# Patient Record
Sex: Male | Born: 1960
Health system: Southern US, Community
[De-identification: ages and names within clinical notes are randomized; demographics above are authoritative.]

## PROBLEM LIST (undated history)

## (undated) DIAGNOSIS — M1990 Unspecified osteoarthritis, unspecified site: Secondary | ICD-10-CM

## (undated) DIAGNOSIS — M069 Rheumatoid arthritis, unspecified: Secondary | ICD-10-CM

## (undated) DIAGNOSIS — E785 Hyperlipidemia, unspecified: Secondary | ICD-10-CM

## (undated) DIAGNOSIS — Z87442 Personal history of urinary calculi: Secondary | ICD-10-CM

## (undated) DIAGNOSIS — I10 Essential (primary) hypertension: Secondary | ICD-10-CM

## (undated) DIAGNOSIS — K635 Polyp of colon: Secondary | ICD-10-CM

## (undated) HISTORY — DX: Unspecified osteoarthritis, unspecified site: M19.90

## (undated) HISTORY — PX: OTHER SURGICAL HISTORY: SHX169

## (undated) HISTORY — DX: Essential (primary) hypertension: I10

## (undated) HISTORY — PX: MENISCUS REPAIR: SHX5179

## (undated) HISTORY — PX: COLONOSCOPY: SHX174

## (undated) HISTORY — PX: POLYPECTOMY: SHX149

## (undated) HISTORY — DX: Polyp of colon: K63.5

## (undated) HISTORY — DX: Hyperlipidemia, unspecified: E78.5

## (undated) HISTORY — PX: HERNIA REPAIR: SHX51

---

## 1999-10-08 ENCOUNTER — Emergency Department (HOSPITAL_COMMUNITY): Admission: EM | Admit: 1999-10-08 | Discharge: 1999-10-08 | Payer: Self-pay | Admitting: Emergency Medicine

## 1999-10-08 ENCOUNTER — Encounter: Payer: Self-pay | Admitting: Emergency Medicine

## 1999-10-09 ENCOUNTER — Emergency Department (HOSPITAL_COMMUNITY): Admission: EM | Admit: 1999-10-09 | Discharge: 1999-10-09 | Payer: Self-pay | Admitting: Emergency Medicine

## 2002-09-20 ENCOUNTER — Encounter: Admission: RE | Admit: 2002-09-20 | Discharge: 2002-09-20 | Payer: Self-pay | Admitting: Internal Medicine

## 2002-09-20 ENCOUNTER — Encounter (HOSPITAL_BASED_OUTPATIENT_CLINIC_OR_DEPARTMENT_OTHER): Payer: Self-pay | Admitting: Internal Medicine

## 2005-08-31 ENCOUNTER — Emergency Department (HOSPITAL_COMMUNITY): Admission: EM | Admit: 2005-08-31 | Discharge: 2005-08-31 | Payer: Self-pay | Admitting: Emergency Medicine

## 2008-08-15 ENCOUNTER — Ambulatory Visit: Payer: Self-pay | Admitting: Internal Medicine

## 2008-08-15 DIAGNOSIS — K625 Hemorrhage of anus and rectum: Secondary | ICD-10-CM | POA: Insufficient documentation

## 2008-09-04 ENCOUNTER — Ambulatory Visit: Payer: Self-pay | Admitting: Internal Medicine

## 2008-09-04 ENCOUNTER — Encounter: Payer: Self-pay | Admitting: Internal Medicine

## 2008-09-07 ENCOUNTER — Encounter: Payer: Self-pay | Admitting: Internal Medicine

## 2009-12-22 ENCOUNTER — Emergency Department (HOSPITAL_COMMUNITY): Admission: EM | Admit: 2009-12-22 | Discharge: 2009-12-22 | Payer: Self-pay | Admitting: Family Medicine

## 2010-01-11 ENCOUNTER — Emergency Department (HOSPITAL_COMMUNITY): Admission: EM | Admit: 2010-01-11 | Discharge: 2010-01-12 | Payer: Self-pay | Admitting: Emergency Medicine

## 2012-04-06 ENCOUNTER — Encounter (HOSPITAL_COMMUNITY): Payer: Self-pay | Admitting: Emergency Medicine

## 2012-04-06 ENCOUNTER — Emergency Department (HOSPITAL_COMMUNITY): Payer: BC Managed Care – PPO

## 2012-04-06 ENCOUNTER — Emergency Department (HOSPITAL_COMMUNITY)
Admission: EM | Admit: 2012-04-06 | Discharge: 2012-04-06 | Disposition: A | Discharge status: Home / Self Care | Payer: BC Managed Care – PPO | Attending: Emergency Medicine | Admitting: Emergency Medicine

## 2012-04-06 DIAGNOSIS — N200 Calculus of kidney: Secondary | ICD-10-CM | POA: Insufficient documentation

## 2012-04-06 DIAGNOSIS — F17200 Nicotine dependence, unspecified, uncomplicated: Secondary | ICD-10-CM | POA: Insufficient documentation

## 2012-04-06 LAB — CBC WITH DIFFERENTIAL/PLATELET
Basophils Relative: 0 % (ref 0–1)
Eosinophils Absolute: 0.2 10*3/uL (ref 0.0–0.7)
Eosinophils Relative: 1 % (ref 0–5)
Hemoglobin: 16.5 g/dL (ref 13.0–17.0)
MCH: 30 pg (ref 26.0–34.0)
MCHC: 34.4 g/dL (ref 30.0–36.0)
MCV: 87.1 fL (ref 78.0–100.0)
Monocytes Absolute: 1.2 10*3/uL — ABNORMAL HIGH (ref 0.1–1.0)
Monocytes Relative: 10 % (ref 3–12)
Neutrophils Relative %: 72 % (ref 43–77)
Platelets: 229 10*3/uL (ref 150–400)
RBC: 5.5 MIL/uL (ref 4.22–5.81)
WBC: 12.1 10*3/uL — ABNORMAL HIGH (ref 4.0–10.5)

## 2012-04-06 LAB — POCT I-STAT, CHEM 8
Calcium, Ion: 1.16 mmol/L (ref 1.12–1.23)
Chloride: 106 mEq/L (ref 96–112)
Creatinine, Ser: 1.1 mg/dL (ref 0.50–1.35)
Glucose, Bld: 137 mg/dL — ABNORMAL HIGH (ref 70–99)
HCT: 50 % (ref 39.0–52.0)
Hemoglobin: 17 g/dL (ref 13.0–17.0)
Potassium: 3.5 mEq/L (ref 3.5–5.1)
Sodium: 143 mEq/L (ref 135–145)
TCO2: 25 mmol/L (ref 0–100)

## 2012-04-06 MED ORDER — ONDANSETRON HCL 4 MG/2ML IJ SOLN
INTRAMUSCULAR | Status: AC
  Filled 2012-04-06: qty 2

## 2012-04-06 MED ORDER — SODIUM CHLORIDE 0.9 % IV BOLUS (SEPSIS)
1000.0000 mL | Freq: Once | INTRAVENOUS | Status: AC
  Administered 2012-04-06: 1000 mL via INTRAVENOUS

## 2012-04-06 MED ORDER — HYDROMORPHONE HCL PF 1 MG/ML IJ SOLN
1.0000 mg | Freq: Once | INTRAMUSCULAR | Status: AC
  Administered 2012-04-06: 1 mg via INTRAVENOUS
  Filled 2012-04-06: qty 1

## 2012-04-06 MED ORDER — ONDANSETRON HCL 4 MG/2ML IJ SOLN
4.0000 mg | Freq: Once | INTRAMUSCULAR | Status: AC
  Administered 2012-04-06: 4 mg via INTRAVENOUS

## 2012-04-06 MED ORDER — KETOROLAC TROMETHAMINE 30 MG/ML IJ SOLN
30.0000 mg | Freq: Once | INTRAMUSCULAR | Status: AC
  Administered 2012-04-06: 30 mg via INTRAVENOUS
  Filled 2012-04-06: qty 1

## 2012-04-06 MED ORDER — HYDROCODONE-ACETAMINOPHEN 5-500 MG PO TABS: 1.0000 | ORAL_TABLET | Freq: Four times a day (QID) | ORAL | Status: AC | PRN

## 2012-04-06 NOTE — ED Notes (Signed)
[  ptn reports abd pain onset x1.5 hrs ago  Pt reports emesis x3  Denies diarrhea

## 2012-04-06 NOTE — ED Notes (Signed)
Pt stated he had to go to the bathroom as soon as he got to room. Pt given urinal and asked for sample.

## 2012-04-06 NOTE — ED Provider Notes (Addendum)
History     CSN: [id number]  Arrival date & time 04/06/12  4098   First MD Initiated Contact with Patient 04/06/12 [phone]      Chief Complaint  Patient presents with  . Abdominal Pain    (Consider location/radiation/quality/duration/timing/severity/associated sxs/prior treatment) Patient is a 51 y.o. male presenting with abdominal pain. The history is provided by the patient.  Abdominal Pain The primary symptoms of the illness include abdominal pain, nausea and vomiting. The primary symptoms of the illness do not include fever, shortness of breath, diarrhea or dysuria. The current episode started 1 to 2 hours ago. The onset of the illness was sudden. The problem has not changed since onset. The abdominal pain is located in the LLQ. The abdominal pain does not radiate. The severity of the abdominal pain is 10/10. The abdominal pain is relieved by nothing. Exacerbated by: nothing.  The vomiting began today. Vomiting occurs 2 to 5 times per day. The emesis contains stomach contents.  The patient has not had a change in bowel habit. Symptoms associated with the illness do not include urgency, frequency or back pain. Significant associated medical issues do not include GERD, inflammatory bowel disease, diabetes, gallstones, diverticulitis or cardiac disease.    History reviewed. No pertinent past medical history.  Past Surgical History  Procedure Date  . Hernia repair     History reviewed. No pertinent family history.  History  Substance Use Topics  . Smoking status: Current Everyday Smoker  . Smokeless tobacco: Not on file  . Alcohol Use: Yes     occassioanl      Review of Systems  Constitutional: Negative for fever.  Respiratory: Negative for shortness of breath.   Gastrointestinal: Positive for nausea, vomiting and abdominal pain. Negative for diarrhea.  Genitourinary: Negative for dysuria, urgency and frequency.  Musculoskeletal: Negative for back pain.  All other systems  reviewed and are negative.    Allergies  Review of patient's allergies indicates no known allergies.  Home Medications  No current outpatient prescriptions on file.  BP 119/72  Pulse 115  Temp 97.7 F (36.5 C) (Oral)  Resp 22  SpO2 100%  Physical Exam  Nursing note and vitals reviewed. Constitutional: He is oriented to person, place, and time. He appears well-developed and well-nourished. He appears distressed.       Pacing in the room obviously in pain  HENT:  Head: Normocephalic and atraumatic.  Mouth/Throat: Oropharynx is clear and moist.  Eyes: Conjunctivae and EOM are normal. Pupils are equal, round, and reactive to light.  Neck: Normal range of motion. Neck supple.  Cardiovascular: Normal rate, regular rhythm and intact distal pulses.   No murmur heard. Pulmonary/Chest: Effort normal and breath sounds normal. No respiratory distress. He has no wheezes. He has no rales.  Abdominal: Soft. Normal appearance. He exhibits no distension. There is no tenderness. There is no rebound, no guarding and no CVA tenderness.    Musculoskeletal: Normal range of motion. He exhibits no edema and no tenderness.  Neurological: He is alert and oriented to person, place, and time.  Skin: Skin is warm and dry. No rash noted. No erythema.  Psychiatric: He has a normal mood and affect. His behavior is normal.    ED Course  Procedures (including critical care time)  Labs Reviewed  CBC WITH DIFFERENTIAL - Abnormal; Notable for the following:    WBC 12.1 (*)     Neutro Abs 8.7 (*)     Monocytes Absolute 1.2 (*)  All other components within normal limits  POCT I-STAT, CHEM 8 - Abnormal; Notable for the following:    Glucose, Bld 137 (*)     All other components within normal limits  URINALYSIS, ROUTINE W REFLEX MICROSCOPIC   Ct Abdomen Pelvis Wo Contrast  04/06/2012  *RADIOLOGY REPORT*  Clinical Data: Left groin pain with nausea and vomiting.  Question ureteral calculus.  CT ABDOMEN  AND PELVIS WITHOUT CONTRAST  Technique:  Multidetector CT imaging of the abdomen and pelvis was performed following the standard protocol without intravenous contrast.  Comparison: Abdominal pelvic CT 08/31/2005.  Findings: There is moderate left-sided hydronephrosis and perinephric soft tissue stranding.  No renal calculi are demonstrated.  However, there is a 1 mm calculus at the left ureteral vesicle junction on image 78.  This may have already passed into the bladder which is decompressed.  The right ureter is normal in caliber.  No focal renal abnormalities are seen.  The lung bases are clear.  There is no pleural effusion.  There is a 1.7 cm ill-defined low density lesion anteriorly in the liver on above the falciform ligament on image 15.  Although this measures higher than fat density and is larger than on the prior study, this could reflect focal fat based on location.  No other focal lesions are identified.  The spleen, gallbladder, pancreas and adrenal glands appear unremarkable as imaged in the noncontrast state.  The bowel gas pattern is normal.  The appendix appears normal.  No adenopathy or ascites is seen. There is new irregular spurring along the inferior aspect of the right pubic bone at the adductor muscle attachment.  This may reflect enthesophyte formation or a post-traumatic finding.  The osseous structures otherwise appear unremarkable.  IMPRESSION:  1.  Obstructing 1 mm calculus at the left ureteral vesicle junction with associated hydronephrosis and perinephric soft tissue stranding.  This may have already passed into the bladder. 2.  No other urinary tract calculi demonstrated. 3.  Nonspecific low density hepatic lesion has a location suggesting focal fat.  Original Report Authenticated By: Gerrianne Scale, M.D.     1. Kidney stone       MDM   Pt with symptoms consistent with kidney stone.  Denies infectious sx, or GI symptoms.  Low concern for diverticulitis and no risk  factors or history suggestive of AAA.  No hx suggestive of GU source (discharge) and otherwise pt is healthy.  Will hydrate, treat pain and ensure no infection with UA, CBC, i-stat and will get stone study to further eval.  9:23 AM On re-eval pt is much improved.  CT showed 1mm stone which may have passed.  Will d/c pt home to return for any problems.       Gwyneth Sprout, MD 04/06/12 1610  Gwyneth Sprout, MD 04/06/12 [phone]

## 2015-03-01 ENCOUNTER — Encounter: Payer: Self-pay | Admitting: Internal Medicine

## 2015-05-09 ENCOUNTER — Telehealth: Payer: Self-pay | Admitting: Internal Medicine

## 2015-05-09 NOTE — Telephone Encounter (Signed)
Pt was due in 2014

## 2015-07-12 ENCOUNTER — Encounter: Payer: Self-pay | Admitting: Internal Medicine

## 2016-02-11 DIAGNOSIS — H5202 Hypermetropia, left eye: Secondary | ICD-10-CM | POA: Diagnosis not present

## 2016-02-11 DIAGNOSIS — H524 Presbyopia: Secondary | ICD-10-CM | POA: Diagnosis not present

## 2016-02-11 DIAGNOSIS — H52223 Regular astigmatism, bilateral: Secondary | ICD-10-CM | POA: Diagnosis not present

## 2016-04-07 DIAGNOSIS — E784 Other hyperlipidemia: Secondary | ICD-10-CM | POA: Diagnosis not present

## 2016-04-07 DIAGNOSIS — H9193 Unspecified hearing loss, bilateral: Secondary | ICD-10-CM | POA: Diagnosis not present

## 2016-04-07 DIAGNOSIS — R030 Elevated blood-pressure reading, without diagnosis of hypertension: Secondary | ICD-10-CM | POA: Diagnosis not present

## 2016-04-07 DIAGNOSIS — R079 Chest pain, unspecified: Secondary | ICD-10-CM | POA: Diagnosis not present

## 2016-05-07 ENCOUNTER — Encounter: Payer: Self-pay | Admitting: Cardiology

## 2016-05-14 DIAGNOSIS — Z125 Encounter for screening for malignant neoplasm of prostate: Secondary | ICD-10-CM | POA: Diagnosis not present

## 2016-05-14 DIAGNOSIS — Z0000 Encounter for general adult medical examination without abnormal findings: Secondary | ICD-10-CM | POA: Diagnosis not present

## 2016-05-19 DIAGNOSIS — Z1389 Encounter for screening for other disorder: Secondary | ICD-10-CM | POA: Diagnosis not present

## 2016-05-19 DIAGNOSIS — Z0000 Encounter for general adult medical examination without abnormal findings: Secondary | ICD-10-CM | POA: Diagnosis not present

## 2016-05-19 DIAGNOSIS — N200 Calculus of kidney: Secondary | ICD-10-CM | POA: Diagnosis not present

## 2016-05-19 DIAGNOSIS — M25532 Pain in left wrist: Secondary | ICD-10-CM | POA: Diagnosis not present

## 2016-05-19 DIAGNOSIS — E784 Other hyperlipidemia: Secondary | ICD-10-CM | POA: Diagnosis not present

## 2016-05-19 DIAGNOSIS — M25531 Pain in right wrist: Secondary | ICD-10-CM | POA: Diagnosis not present

## 2016-06-13 ENCOUNTER — Telehealth: Payer: Self-pay | Admitting: Physician Assistant

## 2016-06-13 NOTE — Telephone Encounter (Signed)
Received records from [facility]. For appointment with Rosaria Ferries on 06/27/2016 2:30pm. Records given to [facility] (medical records) 06/13/2016 mwc

## 2016-06-27 ENCOUNTER — Encounter: Payer: Self-pay | Admitting: Physician Assistant

## 2016-06-27 ENCOUNTER — Ambulatory Visit (INDEPENDENT_AMBULATORY_CARE_PROVIDER_SITE_OTHER): Payer: BLUE CROSS/BLUE SHIELD | Admitting: Physician Assistant

## 2016-06-27 VITALS — BP 136/88 | HR 74 | Ht 71.5 in | Wt 191.0 lb

## 2016-06-27 DIAGNOSIS — R072 Precordial pain: Secondary | ICD-10-CM | POA: Diagnosis not present

## 2016-06-27 DIAGNOSIS — R079 Chest pain, unspecified: Secondary | ICD-10-CM

## 2016-06-27 NOTE — Patient Instructions (Signed)
Medication Instructions:  Your physician recommends that you continue on your current medications as directed. Please refer to the Current Medication list given to you today.   Labwork: NONE   Testing/Procedures:  Your physician has requested that you have an exercise tolerance test. For further information please visit [email or web address]. Please also follow instruction sheet, as given.   Follow-Up: Schedule appointment after test with Rosaria Ferries, PA-C pending results.   Any Other Special Instructions Are Listed Below:   Exercise Stress Electrocardiogram An exercise stress electrocardiogram is a test to check how blood flows to your heart. It is done to find areas of poor blood flow. You will need to walk on a treadmill for this test. The electrocardiogram will record your heartbeat when you are at rest and when you are exercising. BEFORE THE PROCEDURE  Do not have drinks with caffeine or foods with caffeine for 24 hours before the test, or as told by your doctor. This includes coffee, tea (even decaf tea), sodas, chocolate, and cocoa.  Follow your doctor's instructions about eating and drinking before the test.  Ask your doctor what medicines you should or should not take before the test. Take your medicines with water unless told by your doctor not to.  If you use an inhaler, bring it with you to the test.  Bring a snack to eat after the test.  Do not  smoke for 4 hours before the test.  Do not put lotions, powders, creams, or oils on your chest before the test.  Wear comfortable shoes and clothing. PROCEDURE  You will have patches put on your chest. Small areas of your chest may need to be shaved. Wires will be connected to the patches.  Your heart rate will be watched while you are resting and while you are exercising.  You will walk on the treadmill. The treadmill will slowly get faster to raise your heart rate.  The test will take about 1-2 hours. AFTER THE  PROCEDURE  Your heart rate and blood pressure will be watched after the test.  You may return to your normal diet, activities, and medicines or as told by your doctor.   This information is not intended to replace advice given to you by your health care provider. Make sure you discuss any questions you have with your health care provider.   Document Released: 02/11/2008 Document Revised: 09/15/2014 Document Reviewed: 05/02/2013 Elsevier Interactive Patient Education [employer].    If you need a refill on your cardiac medications before your next appointment, please call your pharmacy.

## 2016-06-27 NOTE — Progress Notes (Signed)
Cardiology Office Note   Date:  06/27/2016   ID:  Eric Murray, DOB October 09, 1960, MRN [id number]  PCP:  Donnajean Lopes, MD  Cardiologist:  New - Dr Martinique Jena Tegeler, PA-C   Chief Complaint  Patient presents with  . Chest Pain    History of Present Illness: Eric Murray is a 55 y.o. male with a history of recently diagnosed HTN, HLD, tob use  Eric Murray presents for evaluation of chest pain.   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 He was not getting any rest, was smoking too much and was eating poorly.   At his vacation in August, he admits to dietary indiscretions with bad food into much alcohol as well as excessive tobacco use. After he came back, he was doing some physical work which she had not done in a while. He developed a burning lower sternal/epigastric pain. He thinks it was a 7 or 8/10. It hurt to the point that he had to stop what he was doing. It hurt to take a deep breath. After he stopped what he was doing, and rested about 20 minutes, the pain went away. He had not taken any medications for it. He does not remember if there was any difference with position change. It may have been a little worse with deep inspiration, but he is not sure.   He had similar symptoms like this once before, he was on vacation and on a boat with some friends and relatives. It was not as severe and went away very quickly. This was 6 or 7 years ago.  He has no history of exertional chest pain.  After the episode in August, he realized he needed to make a change. 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 This decreased his stress and increased his activity level. He is now doing what he used to do and the job requires lifting equipment, climbing ladders and doing other things that are fairly strenuous at times. In doing all of these, he has not had any additional  episodes of chest pain. He is trying to decrease smoking and is down to just under a pack a day. He eats generally healthy, but admits that he drinks beer on the weekends and eats fast food at times. He is compliant with his medications and trying to live a healthier lifestyle.   Past Medical History:  Diagnosis Date  . Hyperlipidemia     Past Surgical History:  Procedure Laterality Date  . HERNIA REPAIR      Current Outpatient Prescriptions  Medication Sig Dispense Refill  . amLODipine (NORVASC) 5 MG tablet Take 1 tablet by mouth daily.  12  . aspirin EC 81 MG tablet Take 81 mg by mouth daily.    . pravastatin (PRAVACHOL) 40 MG tablet Take 1 tablet by mouth daily.  12   No current facility-administered medications for this visit.     Allergies:   Review of patient's allergies indicates no known allergies.    Social History:  The patient  reports that he has been smoking.  He does not have any smokeless tobacco history on file. He reports that he drinks alcohol. He reports that he does not use drugs.   Family History:  The patient's family history includes Breast cancer in his sister; Cushing syndrome in his brother; Pancreatic cancer in his father; Prostate cancer in his  father.    ROS:  Please see the history of present illness. All other systems are reviewed and negative.    PHYSICAL EXAM: VS:  BP 136/88   Pulse 74   Ht 5' 11.5" (1.816 m)   Wt 191 lb (86.6 kg)   BMI 26.27 kg/m  , BMI Body mass index is 26.27 kg/m. GEN: Well nourished, well developed, male in no acute distress  HEENT: normal for age  Neck: no JVD, no carotid bruit, no masses Cardiac: RRR; no murmur, no rubs, or gallops Respiratory:  clear to auscultation bilaterally, normal work of breathing GI: soft, nontender, nondistended, + BS MS: no deformity or atrophy; no edema; distal pulses are 2+ in all 4 extremities   Skin: warm and dry, no rash Neuro:  Strength and sensation are intact Psych: euthymic  mood, full affect   EKG:  EKG is ordered today. The ekg ordered today demonstrates Sinus rhythm, heart rate 74, no acute ischemic changes, intervals within normal limits   Recent Labs: No results found for requested labs within last 8760 hours.    Lipid Panel No results found for: CHOL, TRIG, HDL, CHOLHDL, VLDL, LDLCALC, LDLDIRECT   Wt Readings from Last 3 Encounters:  06/27/16 191 lb (86.6 kg)  08/15/08 190 lb (86.2 kg)     Other studies Reviewed: Additional studies/ records that were reviewed today include: Records from [place]: Dr. Martinique was in to see the patient  1.  Chest pain: His cardiac risk factors are hypertension, hyperlipidemia and tobacco use. However, he has not had consistent exertional symptoms and his episodes of chest pain have, in the setting of dietary indiscretions. Multiple cardiac risk factors, assessment is indicated.  We will schedule an exercise treadmill test. If his exercise tolerance is poor, he has symptoms during the test, or his ECG becomes abnormal, further evaluation is indicated.  If the exercise treadmill test is normal, follow-up with [facility] with cardiac risk factor reduction encouraged. As his father died of cancer, he is strongly urged to quit smoking.   Current medicines are reviewed at length with the patient today.  The patient does not have concerns regarding medicines.  The following changes have been made:  no change  Labs/ tests ordered today include:  No orders of the defined types were placed in this encounter.    Disposition:   FU with Dr. Martinique  Signed, Rosaria Ferries, PA-C  06/27/2016 2:38 PM    [place] Phone: [phone]; Fax: [phone]  This note was written with the assistance of speech recognition software. Please excuse any transcriptional errors.

## 2016-07-09 ENCOUNTER — Telehealth (HOSPITAL_COMMUNITY): Payer: Self-pay

## 2016-07-09 NOTE — Telephone Encounter (Signed)
Encounter complete. 

## 2016-07-11 ENCOUNTER — Ambulatory Visit (HOSPITAL_COMMUNITY)
Admission: RE | Admit: 2016-07-11 | Discharge: 2016-07-11 | Disposition: A | Discharge status: Home / Self Care | Payer: BLUE CROSS/BLUE SHIELD | Source: Ambulatory Visit | Attending: Cardiology | Admitting: Cardiology

## 2016-07-11 DIAGNOSIS — R072 Precordial pain: Secondary | ICD-10-CM | POA: Diagnosis not present

## 2016-07-11 LAB — EXERCISE TOLERANCE TEST
Estimated workload: 11.9 METS
Exercise duration (min): 10 min
Exercise duration (sec): 13 s
MPHR: 167 {beats}/min
Peak HR: 160 {beats}/min
Percent HR: 95 %
RPE: 17
Rest HR: 75 {beats}/min

## 2016-07-15 ENCOUNTER — Encounter: Payer: Self-pay | Admitting: Physician Assistant

## 2016-07-18 ENCOUNTER — Ambulatory Visit (INDEPENDENT_AMBULATORY_CARE_PROVIDER_SITE_OTHER): Payer: BLUE CROSS/BLUE SHIELD | Admitting: Physician Assistant

## 2016-07-18 ENCOUNTER — Encounter: Payer: Self-pay | Admitting: Physician Assistant

## 2016-07-18 VITALS — BP 132/91 | HR 78 | Ht 71.5 in | Wt 193.4 lb

## 2016-07-18 DIAGNOSIS — R079 Chest pain, unspecified: Secondary | ICD-10-CM | POA: Diagnosis not present

## 2016-07-18 DIAGNOSIS — Z720 Tobacco use: Secondary | ICD-10-CM

## 2016-07-18 DIAGNOSIS — I10 Essential (primary) hypertension: Secondary | ICD-10-CM | POA: Diagnosis not present

## 2016-07-18 MED ORDER — VARENICLINE TARTRATE 1 MG PO TABS: 1.0000 mg | ORAL_TABLET | Freq: Two times a day (BID) | ORAL | 1 refills | Status: DC

## 2016-07-18 MED ORDER — VARENICLINE TARTRATE 0.5 MG X 11 & 1 MG X 42 PO MISC
ORAL | 0 refills | Status: DC
Start: 2016-07-18 — End: 2016-07-18

## 2016-07-18 MED ORDER — VARENICLINE TARTRATE 0.5 MG X 11 & 1 MG X 42 PO MISC
ORAL | 0 refills | Status: DC
Start: 2016-07-18 — End: 2016-10-06

## 2016-07-18 NOTE — Progress Notes (Signed)
Cardiology Office Note   Date:  07/18/2016   ID:  Eric Murray, DOB 1961-05-22, MRN [id number]  PCP:  Donnajean Lopes, MD  Cardiologist:  Dr Martinique 06/27/2016  Rosaria Ferries, PA-C 06/27/2016  Chief Complaint  Patient presents with  . Follow-up    History of Present Illness: Eric Murray is a 55 y.o. male with a history of  recently diagnosed HTN, HLD, tob use  10/20, seen in office for CP, not consistent w/ exertion. ETT ordered.  11/04, pt had GXT, 10" 13 s, 95% PMHR,11.9 Mets, peak BP 192/90, no CP, no ECG changes  Eric Murray presents for follow up after the test.   No more chest pain, no SOB. Wants to quit tobacco. Had quit with Chantix years ago, tolerated well. He would like to try this again.  Otherwise doing well.  No exertional symptoms. 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 Trying to eat healthy and stay fit.    Past Medical History:  Diagnosis Date  . Benign essential HTN   . Hyperlipidemia     Past Surgical History:  Procedure Laterality Date  . HERNIA REPAIR      Current Outpatient Prescriptions  Medication Sig Dispense Refill  . amLODipine (NORVASC) 5 MG tablet Take 1 tablet by mouth daily.  12  . aspirin EC 81 MG tablet Take 81 mg by mouth daily.    . pravastatin (PRAVACHOL) 40 MG tablet Take 1 tablet by mouth daily.  12   No current facility-administered medications for this visit.     Allergies:   Patient has no known allergies.    Social History:  The patient  reports that he has been smoking Cigarettes.  He has been smoking about 1.00 pack per day. He has never used smokeless tobacco. He reports that he drinks about 10.8 oz of alcohol per week . He reports that he does not use drugs.   Family History:  The patient's family history includes Breast cancer in his sister; Cataracts (age of onset: 70) in his mother; Cushing syndrome in his brother; Pancreatic cancer (age  of onset: 52) in his father; Prostate cancer in his father.    ROS:  Please see the history of present illness. All other systems are reviewed and negative.    PHYSICAL EXAM: VS:  BP (!) 132/91   Pulse 78   Ht 5' 11.5" (1.816 m)   Wt 193 lb 6.4 oz (87.7 kg)   BMI 26.60 kg/m  , BMI Body mass index is 26.6 kg/m. GEN: Well nourished, well developed, male in no acute distress  HEENT: normal for age  Neck: no JVD, no carotid bruit, no masses Cardiac: RRR; no murmur, no rubs, or gallops Respiratory:  clear to auscultation bilaterally, normal work of breathing GI: soft, nontender, nondistended, + BS MS: no deformity or atrophy; no edema; distal pulses are 2+ in all 4 extremities   Skin: warm and dry, no rash Neuro:  Strength and sensation are intact Psych: euthymic mood, full affect   EKG:  EKG is not ordered today.  GXT: 07/11/2016  Blood pressure demonstrated a normal response to exercise.  There was no ST segment deviation noted during stress.  Normal exercise stress test with no EKG criteria of ischemia.  Normal exercise tolerance. The patient exercised to 11.9 mets.  This is a low risk study.   Recent Labs: No results found for requested labs within last 8760 hours.  Lipid Panel No results found for: CHOL, TRIG, HDL, CHOLHDL, VLDL, LDLCALC, LDLDIRECT   Wt Readings from Last 3 Encounters:  07/18/16 193 lb 6.4 oz (87.7 kg)  06/27/16 191 lb (86.6 kg)  08/15/08 190 lb (86.2 kg)     Other studies Reviewed: Additional studies/ records that were reviewed today include: Office note, GXT.  ASSESSMENT AND PLAN:  1.  Chest pain: no consistent exertional symptoms. ECG and GXT were ok. Sx have resolved. No further evaluation needed.   Heart healthy lifestyle changes encouraged. F/u prn  2. Tobacco use: gave rx for Chantix starter pack and subsequent med. F/u with primary MD   3. HTN: BP is a little high today. He is to continue current rx. If he quits tobacco, may  be enough. Will defer management to primary MD since he does not need frequent followup.   Current medicines are reviewed at length with the patient today.  The patient does not have concerns regarding medicines.  The following changes have been made:  no change  Labs/ tests ordered today include:  No orders of the defined types were placed in this encounter.    Disposition:   FU with Dr Martinique  Signed, Rosaria Ferries, PA-C  07/18/2016 3:51 PM    [place] Phone: [phone]; Fax: [phone]  This note was written with the assistance of speech recognition software. Please excuse any transcriptional errors.

## 2016-07-18 NOTE — Patient Instructions (Signed)
Medication Instructions:  1. START CHANTIX : A STARTER PAK AND A MAINTENANCE PAK ; FOLLOW DIRECTIONS ON PACKAGES  Labwork: NONE  Testing/Procedures: NONE  Follow-Up: AS NEEDED AT THIS TIME  Any Other Special Instructions Will Be Listed Below (If Applicable).     If you need a refill on your cardiac medications before your next appointment, please call your pharmacy.

## 2016-10-06 ENCOUNTER — Encounter (HOSPITAL_COMMUNITY): Payer: Self-pay | Admitting: Emergency Medicine

## 2016-10-06 ENCOUNTER — Ambulatory Visit (INDEPENDENT_AMBULATORY_CARE_PROVIDER_SITE_OTHER): Payer: PRIVATE HEALTH INSURANCE

## 2016-10-06 ENCOUNTER — Ambulatory Visit (HOSPITAL_COMMUNITY)
Admission: EM | Admit: 2016-10-06 | Discharge: 2016-10-06 | Disposition: A | Discharge status: Home / Self Care | Payer: PRIVATE HEALTH INSURANCE | Attending: Emergency Medicine | Admitting: Emergency Medicine

## 2016-10-06 DIAGNOSIS — M542 Cervicalgia: Secondary | ICD-10-CM | POA: Diagnosis not present

## 2016-10-06 MED ORDER — CYCLOBENZAPRINE HCL 5 MG PO TABS: 5.0000 mg | ORAL_TABLET | Freq: Three times a day (TID) | ORAL | 0 refills | Status: DC | PRN

## 2016-10-06 MED ORDER — IBUPROFEN 600 MG PO TABS: 600.0000 mg | ORAL_TABLET | Freq: Four times a day (QID) | ORAL | 0 refills | Status: DC | PRN
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
## 2017-06-08 DIAGNOSIS — Z0000 Encounter for general adult medical examination without abnormal findings: Secondary | ICD-10-CM | POA: Diagnosis not present

## 2017-06-08 DIAGNOSIS — Z125 Encounter for screening for malignant neoplasm of prostate: Secondary | ICD-10-CM | POA: Diagnosis not present

## 2017-06-08 DIAGNOSIS — I10 Essential (primary) hypertension: Secondary | ICD-10-CM | POA: Diagnosis not present

## 2017-06-15 DIAGNOSIS — Z1389 Encounter for screening for other disorder: Secondary | ICD-10-CM | POA: Diagnosis not present

## 2017-06-15 DIAGNOSIS — Z720 Tobacco use: Secondary | ICD-10-CM | POA: Diagnosis not present

## 2017-06-15 DIAGNOSIS — M542 Cervicalgia: Secondary | ICD-10-CM | POA: Diagnosis not present

## 2017-06-15 DIAGNOSIS — N528 Other male erectile dysfunction: Secondary | ICD-10-CM | POA: Diagnosis not present

## 2017-06-15 DIAGNOSIS — I10 Essential (primary) hypertension: Secondary | ICD-10-CM | POA: Diagnosis not present

## 2017-06-15 DIAGNOSIS — Z0000 Encounter for general adult medical examination without abnormal findings: Secondary | ICD-10-CM | POA: Diagnosis not present

## 2017-06-15 DIAGNOSIS — Z23 Encounter for immunization: Secondary | ICD-10-CM | POA: Diagnosis not present

## 2017-07-07 ENCOUNTER — Encounter: Payer: Self-pay | Admitting: Internal Medicine

## 2017-08-14 DIAGNOSIS — I10 Essential (primary) hypertension: Secondary | ICD-10-CM | POA: Diagnosis not present

## 2017-08-14 DIAGNOSIS — E7849 Other hyperlipidemia: Secondary | ICD-10-CM | POA: Diagnosis not present

## 2017-08-14 DIAGNOSIS — Z720 Tobacco use: Secondary | ICD-10-CM | POA: Diagnosis not present

## 2017-08-14 DIAGNOSIS — Z6827 Body mass index (BMI) 27.0-27.9, adult: Secondary | ICD-10-CM | POA: Diagnosis not present

## 2017-09-14 ENCOUNTER — Other Ambulatory Visit: Payer: Self-pay

## 2017-09-14 ENCOUNTER — Ambulatory Visit (AMBULATORY_SURGERY_CENTER): Payer: Self-pay | Admitting: *Deleted

## 2017-09-14 ENCOUNTER — Telehealth: Payer: Self-pay | Admitting: Internal Medicine

## 2017-09-14 VITALS — Ht 71.0 in | Wt 194.0 lb

## 2017-09-14 DIAGNOSIS — Z86010 Personal history of colonic polyps: Secondary | ICD-10-CM

## 2017-09-14 MED ORDER — NA SULFATE-K SULFATE-MG SULF 17.5-3.13-1.6 GM/177ML PO SOLN: 1.0000 | Freq: Once | ORAL | 0 refills | Status: AC

## 2017-09-14 NOTE — Progress Notes (Signed)
No egg or soy allergy known to patient  No issues with past sedation with any surgeries  or procedures, no intubation problems  No diet pills per patient No home 02 use per patient  No blood thinners per patient  Pt denies issues with constipation  No A fib or A flutter  EMMI video sent to pt's e mail - $15 suprep on line coupon to pt today in [place]

## 2017-09-14 NOTE — Telephone Encounter (Signed)
Pharmacy said  suprep is $176 and plenvu is more than that - will do MOvi prep sample with new instructions   lot [id number] Exp 08/2020  Pt will come pick up new instructions and sample this week 4th floor

## 2017-09-22 ENCOUNTER — Encounter: Payer: Self-pay | Admitting: Internal Medicine

## 2017-09-28 ENCOUNTER — Other Ambulatory Visit: Payer: Self-pay

## 2017-09-28 ENCOUNTER — Ambulatory Visit (AMBULATORY_SURGERY_CENTER): Payer: BLUE CROSS/BLUE SHIELD | Admitting: Internal Medicine

## 2017-09-28 ENCOUNTER — Encounter: Payer: Self-pay | Admitting: Internal Medicine

## 2017-09-28 VITALS — BP 110/74 | HR 60 | Temp 97.5°F | Resp 12 | Ht 71.0 in | Wt 194.0 lb

## 2017-09-28 DIAGNOSIS — D122 Benign neoplasm of ascending colon: Secondary | ICD-10-CM

## 2017-09-28 DIAGNOSIS — Z86010 Personal history of colonic polyps: Secondary | ICD-10-CM | POA: Diagnosis not present

## 2017-09-28 DIAGNOSIS — Z1211 Encounter for screening for malignant neoplasm of colon: Secondary | ICD-10-CM | POA: Diagnosis not present

## 2017-09-28 DIAGNOSIS — D124 Benign neoplasm of descending colon: Secondary | ICD-10-CM | POA: Diagnosis not present

## 2017-09-28 MED ORDER — SODIUM CHLORIDE 0.9 % IV SOLN: 500.0000 mL | Freq: Once | INTRAVENOUS | Status: DC

## 2017-09-28 NOTE — Progress Notes (Signed)
A and O x3. Report to RN. Tolerated MAC anesthesia well.

## 2017-09-28 NOTE — Op Note (Signed)
[place] Patient Name: Eric Murray Procedure Date: 09/28/2017 8:51 AM MRN: [id number] Endoscopist: Docia Chuck. Henrene Pastor , MD Age: 57 Referring MD:  Date of Birth: [birth year] Gender: Male Account #: [id number] Procedure:                Colonoscopy, with cold snare polypectomy x 2 Indications:              High risk colon cancer surveillance: Personal                            history of non-advanced adenoma. Index examination                            December 2009 Medicines:                Monitored Anesthesia Care Procedure:                Pre-Anesthesia Assessment:                           - Prior to the procedure, a History and Physical                            was performed, and patient medications and                            allergies were reviewed. The patient's tolerance of                            previous anesthesia was also reviewed. The risks                            and benefits of the procedure and the sedation                            options and risks were discussed with the patient.                            All questions were answered, and informed consent                            was obtained. Prior Anticoagulants: The patient has                            taken no previous anticoagulant or antiplatelet                            agents. ASA Grade Assessment: II - A patient with                            mild systemic disease. After reviewing the risks                            and benefits, the patient was deemed in  satisfactory condition to undergo the procedure.                           After obtaining informed consent, the colonoscope                            was passed under direct vision. Throughout the                            procedure, the patient's blood pressure, pulse, and                            oxygen saturations were monitored continuously. The                            Colonoscope was  introduced through the anus and                            advanced to the the cecum, identified by                            appendiceal orifice and ileocecal valve. The                            ileocecal valve, appendiceal orifice, and rectum                            were photographed. The quality of the bowel                            preparation was excellent. The colonoscopy was                            performed without difficulty. The patient tolerated                            the procedure well. The bowel preparation used was                            SUPREP. Scope In: 8:54:52 AM Scope Out: 9:09:29 AM Scope Withdrawal Time: 0 hours 11 minutes 44 seconds  Total Procedure Duration: 0 hours 14 minutes 37 seconds  Findings:                 Two polyps were found in the descending colon and                            ascending colon. The polyps were 2 to 3 mm in size.                            These polyps were removed with a cold snare.                            Resection and retrieval were complete.  Internal hemorrhoids were found during retroflexion.                           The exam was otherwise without abnormality on                            direct and retroflexion views. Complications:            No immediate complications. Estimated blood loss:                            None. Estimated Blood Loss:     Estimated blood loss: none. Impression:               - Two 2 to 3 mm polyps in the descending colon and                            in the ascending colon, removed with a cold snare.                            Resected and retrieved.                           - Internal hemorrhoids.                           - The examination was otherwise normal on direct                            and retroflexion views. Recommendation:           - Repeat colonoscopy in 5 years for surveillance.                           - Patient has a contact number  available for                            emergencies. The signs and symptoms of potential                            delayed complications were discussed with the                            patient. Return to normal activities tomorrow.                            Written discharge instructions were provided to the                            patient.                           - Resume previous diet.                           - Continue present medications.                           -  Await pathology results. Docia Chuck. Henrene Pastor, MD 09/28/2017 9:16:39 AM This report has been signed electronically.

## 2017-09-28 NOTE — Progress Notes (Signed)
Called to room to assist during endoscopic procedure.  Patient ID and intended procedure confirmed with present staff. Received instructions for my participation in the procedure from the performing physician.  

## 2017-09-28 NOTE — Patient Instructions (Signed)
YOU HAD AN ENDOSCOPIC PROCEDURE TODAY AT [facility]:   Refer to the procedure report that was given to you for any specific questions about what was found during the examination.  If the procedure report does not answer your questions, please call your gastroenterologist to clarify.  If you requested that your care partner not be given the details of your procedure findings, then the procedure report has been included in a sealed envelope for you to review at your convenience later.  YOU SHOULD EXPECT: Some feelings of bloating in the abdomen. Passage of more gas than usual.  Walking can help get rid of the air that was put into your GI tract during the procedure and reduce the bloating. If you had a lower endoscopy (such as a colonoscopy or flexible sigmoidoscopy) you may notice spotting of blood in your stool or on the toilet paper. If you underwent a bowel prep for your procedure, you may not have a normal bowel movement for a few days.  Please Note:  You might notice some irritation and congestion in your nose or some drainage.  This is from the oxygen used during your procedure.  There is no need for concern and it should clear up in a day or so.  SYMPTOMS TO REPORT IMMEDIATELY:   Following lower endoscopy (colonoscopy or flexible sigmoidoscopy):  Excessive amounts of blood in the stool  Significant tenderness or worsening of abdominal pains  Swelling of the abdomen that is new, acute  Fever of 100F or higher  For urgent or emergent issues, a gastroenterologist can be reached at any hour by calling [phone].   DIET:  We do recommend a small meal at first, but then you may proceed to your regular diet.  Drink plenty of fluids but you should avoid alcoholic beverages for 24 hours.  ACTIVITY:  You should plan to take it easy for the rest of today and you should NOT DRIVE or use heavy machinery until tomorrow (because of the sedation medicines used during the test).     FOLLOW UP: Our staff will call the number listed on your records the next business day following your procedure to check on you and address any questions or concerns that you may have regarding the information given to you following your procedure. If we do not reach you, we will leave a message.  However, if you are feeling well and you are not experiencing any problems, there is no need to return our call.  We will assume that you have returned to your regular daily activities without incident.  If any biopsies were taken you will be contacted by phone or by letter within the next 1-3 weeks.  Please call us at [phone] if you have not heard about the biopsies in 3 weeks.  Await for biopsy results to determined next repeat Colonoscopy Polyps (handout given) Hemorrhoids (handout given)  SIGNATURES/CONFIDENTIALITY: You and/or your care partner have signed paperwork which will be entered into your electronic medical record.  These signatures attest to the fact that that the information above on your After Visit Summary has been reviewed and is understood.  Full responsibility of the confidentiality of this discharge information lies with you and/or your care-partner.

## 2017-09-29 ENCOUNTER — Telehealth: Payer: Self-pay

## 2017-09-29 NOTE — Telephone Encounter (Signed)
  Follow up Call-  Call back number 09/28/2017  Post procedure Call Back phone  # [phone]  Permission to leave phone message Yes  Some recent data might be hidden     Patient questions:  Do you have a fever, pain , or abdominal swelling? No. Pain Score  0 *  Have you tolerated food without any problems? Yes.    Have you been able to return to your normal activities? Yes.    Do you have any questions about your discharge instructions: Diet   No. Medications  No. Follow up visit  No.  Do you have questions or concerns about your Care? No.  Actions: * If pain score is 4 or above: No action needed, pain <4.

## 2017-10-02 ENCOUNTER — Encounter: Payer: Self-pay | Admitting: Internal Medicine

## 2017-10-27 ENCOUNTER — Other Ambulatory Visit: Payer: Self-pay | Admitting: Physician Assistant

## 2017-10-27 NOTE — Telephone Encounter (Signed)
chantix refill refused. Defer to PCP per cardiologist

## 2017-10-27 NOTE — Telephone Encounter (Signed)
I will not fill. Will need to get from primary care.  Peter Martinique MD, [facility]

## 2017-11-13 DIAGNOSIS — Z1389 Encounter for screening for other disorder: Secondary | ICD-10-CM | POA: Diagnosis not present

## 2017-11-13 DIAGNOSIS — E7849 Other hyperlipidemia: Secondary | ICD-10-CM | POA: Diagnosis not present

## 2017-11-13 DIAGNOSIS — I10 Essential (primary) hypertension: Secondary | ICD-10-CM | POA: Diagnosis not present

## 2017-11-13 DIAGNOSIS — Z720 Tobacco use: Secondary | ICD-10-CM | POA: Diagnosis not present

## 2017-11-13 DIAGNOSIS — N528 Other male erectile dysfunction: Secondary | ICD-10-CM | POA: Diagnosis not present

## 2018-06-14 DIAGNOSIS — Z0000 Encounter for general adult medical examination without abnormal findings: Secondary | ICD-10-CM | POA: Diagnosis not present

## 2018-06-14 DIAGNOSIS — R82998 Other abnormal findings in urine: Secondary | ICD-10-CM | POA: Diagnosis not present

## 2018-06-14 DIAGNOSIS — Z125 Encounter for screening for malignant neoplasm of prostate: Secondary | ICD-10-CM | POA: Diagnosis not present

## 2018-06-14 DIAGNOSIS — I10 Essential (primary) hypertension: Secondary | ICD-10-CM | POA: Diagnosis not present

## 2018-06-21 DIAGNOSIS — Z720 Tobacco use: Secondary | ICD-10-CM | POA: Diagnosis not present

## 2018-06-21 DIAGNOSIS — I10 Essential (primary) hypertension: Secondary | ICD-10-CM | POA: Diagnosis not present

## 2018-06-21 DIAGNOSIS — E7849 Other hyperlipidemia: Secondary | ICD-10-CM | POA: Diagnosis not present

## 2018-06-21 DIAGNOSIS — N528 Other male erectile dysfunction: Secondary | ICD-10-CM | POA: Diagnosis not present

## 2018-06-21 DIAGNOSIS — Z0000 Encounter for general adult medical examination without abnormal findings: Secondary | ICD-10-CM | POA: Diagnosis not present

## 2018-06-21 DIAGNOSIS — Z23 Encounter for immunization: Secondary | ICD-10-CM | POA: Diagnosis not present

## 2018-09-20 DIAGNOSIS — M25511 Pain in right shoulder: Secondary | ICD-10-CM | POA: Diagnosis not present

## 2018-09-20 DIAGNOSIS — Z6825 Body mass index (BMI) 25.0-25.9, adult: Secondary | ICD-10-CM | POA: Diagnosis not present

## 2018-09-20 DIAGNOSIS — M79671 Pain in right foot: Secondary | ICD-10-CM | POA: Diagnosis not present

## 2018-10-06 DIAGNOSIS — M25571 Pain in right ankle and joints of right foot: Secondary | ICD-10-CM | POA: Diagnosis not present

## 2018-10-06 DIAGNOSIS — M25511 Pain in right shoulder: Secondary | ICD-10-CM | POA: Diagnosis not present

## 2018-11-17 DIAGNOSIS — M25511 Pain in right shoulder: Secondary | ICD-10-CM | POA: Diagnosis not present

## 2018-11-17 DIAGNOSIS — M25571 Pain in right ankle and joints of right foot: Secondary | ICD-10-CM | POA: Diagnosis not present

## 2018-11-22 DIAGNOSIS — Z6826 Body mass index (BMI) 26.0-26.9, adult: Secondary | ICD-10-CM | POA: Diagnosis not present

## 2018-11-22 DIAGNOSIS — M25512 Pain in left shoulder: Secondary | ICD-10-CM | POA: Diagnosis not present

## 2018-11-22 DIAGNOSIS — M25511 Pain in right shoulder: Secondary | ICD-10-CM | POA: Diagnosis not present

## 2018-12-15 DIAGNOSIS — M25571 Pain in right ankle and joints of right foot: Secondary | ICD-10-CM | POA: Diagnosis not present

## 2018-12-15 DIAGNOSIS — M25532 Pain in left wrist: Secondary | ICD-10-CM | POA: Diagnosis not present

## 2018-12-15 DIAGNOSIS — M25562 Pain in left knee: Secondary | ICD-10-CM | POA: Diagnosis not present

## 2018-12-15 DIAGNOSIS — M25531 Pain in right wrist: Secondary | ICD-10-CM | POA: Diagnosis not present

## 2019-01-03 DIAGNOSIS — M25562 Pain in left knee: Secondary | ICD-10-CM | POA: Diagnosis not present

## 2019-01-10 DIAGNOSIS — M25562 Pain in left knee: Secondary | ICD-10-CM | POA: Diagnosis not present

## 2019-01-12 DIAGNOSIS — M25562 Pain in left knee: Secondary | ICD-10-CM | POA: Diagnosis not present

## 2019-01-27 ENCOUNTER — Other Ambulatory Visit: Payer: Self-pay | Admitting: Orthopedic Surgery

## 2019-01-27 DIAGNOSIS — S83242A Other tear of medial meniscus, current injury, left knee, initial encounter: Secondary | ICD-10-CM | POA: Diagnosis not present

## 2019-01-27 DIAGNOSIS — X58XXXA Exposure to other specified factors, initial encounter: Secondary | ICD-10-CM | POA: Diagnosis not present

## 2019-01-27 DIAGNOSIS — S83242D Other tear of medial meniscus, current injury, left knee, subsequent encounter: Secondary | ICD-10-CM | POA: Diagnosis not present

## 2019-01-27 DIAGNOSIS — Y999 Unspecified external cause status: Secondary | ICD-10-CM | POA: Diagnosis not present

## 2019-01-27 DIAGNOSIS — M6588 Other synovitis and tenosynovitis, other site: Secondary | ICD-10-CM | POA: Diagnosis not present

## 2019-02-09 DIAGNOSIS — S83242D Other tear of medial meniscus, current injury, left knee, subsequent encounter: Secondary | ICD-10-CM | POA: Diagnosis not present

## 2019-02-16 DIAGNOSIS — S83242D Other tear of medial meniscus, current injury, left knee, subsequent encounter: Secondary | ICD-10-CM | POA: Diagnosis not present

## 2019-02-16 DIAGNOSIS — M25531 Pain in right wrist: Secondary | ICD-10-CM | POA: Diagnosis not present

## 2019-02-16 DIAGNOSIS — M25571 Pain in right ankle and joints of right foot: Secondary | ICD-10-CM | POA: Diagnosis not present

## 2019-03-09 DIAGNOSIS — S83242D Other tear of medial meniscus, current injury, left knee, subsequent encounter: Secondary | ICD-10-CM | POA: Diagnosis not present

## 2019-04-06 DIAGNOSIS — S83242D Other tear of medial meniscus, current injury, left knee, subsequent encounter: Secondary | ICD-10-CM | POA: Diagnosis not present

## 2019-04-20 DIAGNOSIS — M7989 Other specified soft tissue disorders: Secondary | ICD-10-CM | POA: Diagnosis not present

## 2019-04-20 DIAGNOSIS — M2550 Pain in unspecified joint: Secondary | ICD-10-CM | POA: Diagnosis not present

## 2019-04-20 DIAGNOSIS — M25462 Effusion, left knee: Secondary | ICD-10-CM | POA: Diagnosis not present

## 2019-04-20 DIAGNOSIS — Z111 Encounter for screening for respiratory tuberculosis: Secondary | ICD-10-CM | POA: Diagnosis not present

## 2019-05-04 DIAGNOSIS — M25462 Effusion, left knee: Secondary | ICD-10-CM | POA: Diagnosis not present

## 2019-05-04 DIAGNOSIS — M7989 Other specified soft tissue disorders: Secondary | ICD-10-CM | POA: Diagnosis not present

## 2019-05-04 DIAGNOSIS — M2550 Pain in unspecified joint: Secondary | ICD-10-CM | POA: Diagnosis not present

## 2019-05-04 DIAGNOSIS — M0579 Rheumatoid arthritis with rheumatoid factor of multiple sites without organ or systems involvement: Secondary | ICD-10-CM | POA: Diagnosis not present

## 2019-06-21 DIAGNOSIS — Z125 Encounter for screening for malignant neoplasm of prostate: Secondary | ICD-10-CM | POA: Diagnosis not present

## 2019-06-21 DIAGNOSIS — Z23 Encounter for immunization: Secondary | ICD-10-CM | POA: Diagnosis not present

## 2019-06-21 DIAGNOSIS — Z0000 Encounter for general adult medical examination without abnormal findings: Secondary | ICD-10-CM | POA: Diagnosis not present

## 2019-06-21 DIAGNOSIS — E7849 Other hyperlipidemia: Secondary | ICD-10-CM | POA: Diagnosis not present

## 2019-06-27 DIAGNOSIS — Z0000 Encounter for general adult medical examination without abnormal findings: Secondary | ICD-10-CM | POA: Diagnosis not present

## 2019-06-27 DIAGNOSIS — Z720 Tobacco use: Secondary | ICD-10-CM | POA: Diagnosis not present

## 2019-06-27 DIAGNOSIS — I10 Essential (primary) hypertension: Secondary | ICD-10-CM | POA: Diagnosis not present

## 2019-06-27 DIAGNOSIS — E785 Hyperlipidemia, unspecified: Secondary | ICD-10-CM | POA: Diagnosis not present

## 2019-06-27 DIAGNOSIS — Z1331 Encounter for screening for depression: Secondary | ICD-10-CM | POA: Diagnosis not present

## 2019-06-27 DIAGNOSIS — N529 Male erectile dysfunction, unspecified: Secondary | ICD-10-CM | POA: Diagnosis not present

## 2019-07-01 DIAGNOSIS — M0579 Rheumatoid arthritis with rheumatoid factor of multiple sites without organ or systems involvement: Secondary | ICD-10-CM | POA: Diagnosis not present

## 2019-07-07 DIAGNOSIS — M7989 Other specified soft tissue disorders: Secondary | ICD-10-CM | POA: Diagnosis not present

## 2019-07-07 DIAGNOSIS — M0579 Rheumatoid arthritis with rheumatoid factor of multiple sites without organ or systems involvement: Secondary | ICD-10-CM | POA: Diagnosis not present

## 2019-07-07 DIAGNOSIS — M25462 Effusion, left knee: Secondary | ICD-10-CM | POA: Diagnosis not present

## 2019-07-07 DIAGNOSIS — M2550 Pain in unspecified joint: Secondary | ICD-10-CM | POA: Diagnosis not present

## 2019-10-03 DIAGNOSIS — M7989 Other specified soft tissue disorders: Secondary | ICD-10-CM | POA: Diagnosis not present

## 2019-10-03 DIAGNOSIS — M2550 Pain in unspecified joint: Secondary | ICD-10-CM | POA: Diagnosis not present

## 2019-10-03 DIAGNOSIS — M25462 Effusion, left knee: Secondary | ICD-10-CM | POA: Diagnosis not present

## 2019-10-03 DIAGNOSIS — M0579 Rheumatoid arthritis with rheumatoid factor of multiple sites without organ or systems involvement: Secondary | ICD-10-CM | POA: Diagnosis not present

## 2019-10-10 DIAGNOSIS — M0579 Rheumatoid arthritis with rheumatoid factor of multiple sites without organ or systems involvement: Secondary | ICD-10-CM | POA: Diagnosis not present

## 2019-10-24 DIAGNOSIS — M1712 Unilateral primary osteoarthritis, left knee: Secondary | ICD-10-CM | POA: Diagnosis not present

## 2020-02-29 DIAGNOSIS — M1712 Unilateral primary osteoarthritis, left knee: Secondary | ICD-10-CM | POA: Diagnosis not present

## 2020-03-28 DIAGNOSIS — M25462 Effusion, left knee: Secondary | ICD-10-CM | POA: Diagnosis not present

## 2020-04-02 ENCOUNTER — Ambulatory Visit (INDEPENDENT_AMBULATORY_CARE_PROVIDER_SITE_OTHER): Payer: BC Managed Care – PPO | Admitting: Orthopedic Surgery

## 2020-04-02 ENCOUNTER — Encounter: Payer: Self-pay | Admitting: Orthopedic Surgery

## 2020-04-02 ENCOUNTER — Other Ambulatory Visit: Payer: Self-pay

## 2020-04-02 VITALS — Ht 71.0 in | Wt 194.0 lb

## 2020-04-02 DIAGNOSIS — M009 Pyogenic arthritis, unspecified: Secondary | ICD-10-CM

## 2020-04-02 DIAGNOSIS — G8929 Other chronic pain: Secondary | ICD-10-CM | POA: Diagnosis not present

## 2020-04-02 DIAGNOSIS — M25562 Pain in left knee: Secondary | ICD-10-CM | POA: Diagnosis not present

## 2020-04-02 MED ORDER — LIDOCAINE HCL 1 % IJ SOLN
1.0000 mL | INTRAMUSCULAR | Status: AC | PRN
  Administered 2020-04-02: 1 mL

## 2020-04-02 NOTE — Progress Notes (Signed)
Office Visit Note   Patient: Eric Murray           Date of Birth: Jun 09, 1961           MRN: [id number] Visit Date: 04/02/2020              Requested by: Leanna Battles, [address],  [place] [address] PCP: Leanna Battles, MD  Chief Complaint  Patient presents with  . Left Knee - Pain      HPI: Patient is a 59 year old gentleman with rheumatoid arthritis he is currently on methotrexate and Humira he has been on prednisone he complains of painful swelling of the left knee he is status post left knee arthroscopic debridement last year and subsequently has developed a painful swelling over the past 4 weeks..  Patient states he goes to Dr. Rondel Oh rheumatology at [facility].  Patient is scheduled for an MRI scan of the left knee tomorrow.  Patient did have a synovial biopsy with Dr. Mardelle Matte on 01/27/2019 which showed inflammation.  Assessment & Plan: Visit Diagnoses:  1. Chronic pain of left knee   2. Pyogenic arthritis of left knee joint, due to unspecified organism [facility])     Plan: Aspirate fluid left knee is sent for Gram stain culture and cell count.  We will review the MRI scan tomorrow and most likely will post patient for surgery on Wednesday for synovectomy of the left knee.  Follow-Up Instructions: Return in about 1 week (around 04/09/2020).   Ortho Exam  Patient is alert, oriented, no adenopathy, well-dressed, normal affect, normal respiratory effort. Semination patient does have an antalgic gait he is not on antibiotics arthroscopy was last year.  He states he is having 1 month acute swelling.  He denies any history of gout denies a history of psoriatic arthritis.  Examination of the knee there is no redness no cellulitis there is a moderate effusion.  After informed consent the knee was aspirated of cloudy serosanguineous fluid 15 cc.  Imaging: No results found. No images are attached to the encounter.  Labs: No results found  for: HGBA1C, ESRSEDRATE, CRP, LABURIC, REPTSTATUS, GRAMSTAIN, CULT, LABORGA   No results found for: ALBUMIN, PREALBUMIN, LABURIC  No results found for: MG No results found for: VD25OH  No results found for: PREALBUMIN CBC EXTENDED Latest Ref Rng & Units 04/06/2012 04/06/2012  WBC 4.0 - 10.5 K/uL - 12.1(H)  RBC 4.22 - 5.81 MIL/uL - 5.50  HGB 13.0 - 17.0 g/dL 17.0 16.5  HCT 39 - 52 % 50.0 47.9  PLT 150 - 400 K/uL - 229  NEUTROABS 1.7 - 7.7 K/uL - 8.7(H)  LYMPHSABS 0.7 - 4.0 K/uL - 1.9     Body mass index is 27.06 kg/m.  Orders:  Orders Placed This Encounter  Procedures  . Anaerobic and Aerobic Culture  . Gram stain  . Cell count + diff,  w/ cryst-synvl fld   No orders of the defined types were placed in this encounter.    Procedures: Large Joint Inj: L knee on 04/02/2020 11:39 AM Indications: pain and diagnostic evaluation Details: 22 G 1.5 in needle, superolateral approach  Arthrogram: No  Medications: 1 mL lidocaine 1 % Aspirate: 15 mL cloudy Outcome: tolerated well, no immediate complications Procedure, treatment alternatives, risks and benefits explained, specific risks discussed. Consent was given by the patient. Immediately prior to procedure a time out was called to verify the correct patient, procedure, equipment, support staff and site/side marked as required. Patient was  prepped and draped in the usual sterile fashion.      Clinical Data: No additional findings.  ROS:  All other systems negative, except as noted in the HPI. Review of Systems  Objective: Vital Signs: Ht 5\' 11"  (1.803 m)   Wt 194 lb (88 kg)   BMI 27.06 kg/m   Specialty Comments:  No specialty comments available.  PMFS History: Patient Active Problem List   Diagnosis Date Noted  . RECTAL BLEEDING 08/15/2008   Past Medical History:  Diagnosis Date  . Arthritis   . Benign essential HTN   . Colon polyps    TA 2009  . Hyperlipidemia     Family History  Problem Relation Age  of Onset  . Cataracts Mother 23       Pt died of old age  . Pancreatic cancer Father 82  . Prostate cancer Father   . Breast cancer Sister   . Cushing syndrome Brother   . Colon cancer Neg Hx   . Colon polyps Neg Hx     Past Surgical History:  Procedure Laterality Date  . arm fracture surgery     as child   . COLONOSCOPY    . HERNIA REPAIR    . POLYPECTOMY     Social History   Occupational History  . Occupation: ELECTRICIAN    Employer: [employer]  Tobacco Use  . Smoking status: Current Every Day Smoker    Packs/day: 1.00    Types: Cigarettes  . Smokeless tobacco: Never Used  Vaping Use  . Vaping Use: Never used  Substance and Sexual Activity  . Alcohol use: Yes    Alcohol/week: 18.0 standard drinks    Types: 18 Cans of beer per week    Comment: mostly weekends  . Drug use: No  . Sexual activity: Not on file

## 2020-04-03 ENCOUNTER — Other Ambulatory Visit (HOSPITAL_COMMUNITY)
Admission: RE | Admit: 2020-04-03 | Discharge: 2020-04-03 | Disposition: A | Discharge status: Home / Self Care | Payer: BC Managed Care – PPO | Source: Ambulatory Visit | Attending: Orthopedic Surgery | Admitting: Orthopedic Surgery

## 2020-04-03 ENCOUNTER — Encounter (HOSPITAL_COMMUNITY): Payer: Self-pay | Admitting: Orthopedic Surgery

## 2020-04-03 ENCOUNTER — Other Ambulatory Visit: Payer: Self-pay | Admitting: Physician Assistant

## 2020-04-03 DIAGNOSIS — M71162 Other infective bursitis, left knee: Secondary | ICD-10-CM | POA: Diagnosis not present

## 2020-04-03 DIAGNOSIS — M65162 Other infective (teno)synovitis, left knee: Secondary | ICD-10-CM | POA: Diagnosis not present

## 2020-04-03 DIAGNOSIS — E785 Hyperlipidemia, unspecified: Secondary | ICD-10-CM | POA: Diagnosis not present

## 2020-04-03 DIAGNOSIS — M25562 Pain in left knee: Secondary | ICD-10-CM | POA: Diagnosis not present

## 2020-04-03 DIAGNOSIS — M069 Rheumatoid arthritis, unspecified: Secondary | ICD-10-CM | POA: Diagnosis not present

## 2020-04-03 DIAGNOSIS — B9689 Other specified bacterial agents as the cause of diseases classified elsewhere: Secondary | ICD-10-CM | POA: Diagnosis not present

## 2020-04-03 DIAGNOSIS — I10 Essential (primary) hypertension: Secondary | ICD-10-CM | POA: Diagnosis not present

## 2020-04-03 DIAGNOSIS — M00862 Arthritis due to other bacteria, left knee: Secondary | ICD-10-CM | POA: Diagnosis not present

## 2020-04-03 DIAGNOSIS — Z8719 Personal history of other diseases of the digestive system: Secondary | ICD-10-CM | POA: Diagnosis not present

## 2020-04-03 DIAGNOSIS — M009 Pyogenic arthritis, unspecified: Secondary | ICD-10-CM | POA: Diagnosis not present

## 2020-04-03 DIAGNOSIS — Z87442 Personal history of urinary calculi: Secondary | ICD-10-CM | POA: Diagnosis not present

## 2020-04-03 DIAGNOSIS — M0579 Rheumatoid arthritis with rheumatoid factor of multiple sites without organ or systems involvement: Secondary | ICD-10-CM | POA: Diagnosis not present

## 2020-04-03 DIAGNOSIS — Z01812 Encounter for preprocedural laboratory examination: Secondary | ICD-10-CM | POA: Insufficient documentation

## 2020-04-03 DIAGNOSIS — Z87891 Personal history of nicotine dependence: Secondary | ICD-10-CM | POA: Diagnosis not present

## 2020-04-03 DIAGNOSIS — M25462 Effusion, left knee: Secondary | ICD-10-CM | POA: Diagnosis not present

## 2020-04-03 DIAGNOSIS — Z20822 Contact with and (suspected) exposure to covid-19: Secondary | ICD-10-CM | POA: Diagnosis not present

## 2020-04-03 LAB — SARS CORONAVIRUS 2 (TAT 6-24 HRS): SARS Coronavirus 2: NEGATIVE

## 2020-04-03 NOTE — Progress Notes (Signed)
Patient denies chest pain, shortness of breath, cardiology visit, or cardiac test. Educated on visitation policy. Reports he will remain in quarantine.

## 2020-04-04 ENCOUNTER — Inpatient Hospital Stay (HOSPITAL_COMMUNITY): Payer: BC Managed Care – PPO | Admitting: Anesthesiology

## 2020-04-04 ENCOUNTER — Inpatient Hospital Stay (HOSPITAL_COMMUNITY)
Admission: RE | Admit: 2020-04-04 | Discharge: 2020-04-07 | DRG: 464 | Disposition: A | Discharge status: Home Health | Payer: BC Managed Care – PPO | Attending: Orthopedic Surgery | Admitting: Orthopedic Surgery

## 2020-04-04 ENCOUNTER — Encounter (HOSPITAL_COMMUNITY): Payer: Self-pay | Admitting: Orthopedic Surgery

## 2020-04-04 ENCOUNTER — Encounter (HOSPITAL_COMMUNITY)
Admission: RE | Disposition: A | Discharge status: Home / Self Care | Payer: Self-pay | Source: Home / Self Care | Attending: Orthopedic Surgery

## 2020-04-04 ENCOUNTER — Telehealth: Payer: Self-pay | Admitting: Orthopedic Surgery

## 2020-04-04 ENCOUNTER — Other Ambulatory Visit: Payer: Self-pay

## 2020-04-04 DIAGNOSIS — Z20822 Contact with and (suspected) exposure to covid-19: Secondary | ICD-10-CM | POA: Diagnosis present

## 2020-04-04 DIAGNOSIS — M069 Rheumatoid arthritis, unspecified: Secondary | ICD-10-CM | POA: Diagnosis present

## 2020-04-04 DIAGNOSIS — I10 Essential (primary) hypertension: Secondary | ICD-10-CM | POA: Diagnosis present

## 2020-04-04 DIAGNOSIS — M0579 Rheumatoid arthritis with rheumatoid factor of multiple sites without organ or systems involvement: Secondary | ICD-10-CM | POA: Diagnosis not present

## 2020-04-04 DIAGNOSIS — Z87891 Personal history of nicotine dependence: Secondary | ICD-10-CM

## 2020-04-04 DIAGNOSIS — M65162 Other infective (teno)synovitis, left knee: Secondary | ICD-10-CM | POA: Diagnosis present

## 2020-04-04 DIAGNOSIS — M00862 Arthritis due to other bacteria, left knee: Secondary | ICD-10-CM

## 2020-04-04 DIAGNOSIS — E785 Hyperlipidemia, unspecified: Secondary | ICD-10-CM | POA: Diagnosis present

## 2020-04-04 DIAGNOSIS — B9689 Other specified bacterial agents as the cause of diseases classified elsewhere: Secondary | ICD-10-CM | POA: Diagnosis not present

## 2020-04-04 DIAGNOSIS — Z87442 Personal history of urinary calculi: Secondary | ICD-10-CM

## 2020-04-04 DIAGNOSIS — M009 Pyogenic arthritis, unspecified: Secondary | ICD-10-CM | POA: Diagnosis present

## 2020-04-04 DIAGNOSIS — Z8719 Personal history of other diseases of the digestive system: Secondary | ICD-10-CM | POA: Diagnosis not present

## 2020-04-04 DIAGNOSIS — M71162 Other infective bursitis, left knee: Secondary | ICD-10-CM | POA: Diagnosis present

## 2020-04-04 HISTORY — DX: Rheumatoid arthritis, unspecified: M06.9

## 2020-04-04 HISTORY — PX: I & D EXTREMITY: SHX5045

## 2020-04-04 HISTORY — PX: SYNOVECTOMY: SHX5180

## 2020-04-04 HISTORY — DX: Personal history of urinary calculi: Z87.442

## 2020-04-04 LAB — BASIC METABOLIC PANEL
Anion gap: 12 (ref 5–15)
BUN: 11 mg/dL (ref 6–20)
CO2: 22 mmol/L (ref 22–32)
Calcium: 9.2 mg/dL (ref 8.9–10.3)
Chloride: 104 mmol/L (ref 98–111)
Creatinine, Ser: 0.79 mg/dL (ref 0.61–1.24)
GFR calc Af Amer: >60 mL/min (ref >60)
GFR calc non Af Amer: >60 mL/min (ref >60)
Glucose, Bld: 90 mg/dL (ref 70–99)
Potassium: 4.1 mmol/L (ref 3.5–5.1)
Sodium: 138 mmol/L (ref 135–145)

## 2020-04-04 LAB — CBC
HCT: 38.1 % — ABNORMAL LOW (ref 39.0–52.0)
Hemoglobin: 11.8 g/dL — ABNORMAL LOW (ref 13.0–17.0)
MCH: 27.8 pg (ref 26.0–34.0)
MCHC: 31 g/dL (ref 30.0–36.0)
MCV: 89.6 fL (ref 80.0–100.0)
Platelets: 459 10*3/uL — ABNORMAL HIGH (ref 150–400)
RBC: 4.25 MIL/uL (ref 4.22–5.81)
RDW: 13.2 % (ref 11.5–15.5)
WBC: 7.8 10*3/uL (ref 4.0–10.5)
nRBC: 0 % (ref 0.0–0.2)

## 2020-04-04 SURGERY — IRRIGATION AND DEBRIDEMENT EXTREMITY
Anesthesia: General | Laterality: Left

## 2020-04-04 MED ORDER — ACETAMINOPHEN 10 MG/ML IV SOLN
INTRAVENOUS | Status: DC | PRN
  Administered 2020-04-04: 1000 mg via INTRAVENOUS

## 2020-04-04 MED ORDER — SODIUM CHLORIDE 0.9 % IV SOLN
2.0000 g | INTRAVENOUS | Status: DC
  Administered 2020-04-04 – 2020-04-06 (×3): 2 g via INTRAVENOUS
  Filled 2020-04-04 (×4): qty 20

## 2020-04-04 MED ORDER — TRANEXAMIC ACID-NACL 1000-0.7 MG/100ML-% IV SOLN
INTRAVENOUS | Status: AC
  Filled 2020-04-04: qty 100

## 2020-04-04 MED ORDER — FENTANYL CITRATE (PF) 100 MCG/2ML IJ SOLN
INTRAMUSCULAR | Status: AC
  Filled 2020-04-04: qty 2

## 2020-04-04 MED ORDER — VANCOMYCIN HCL 1500 MG/300ML IV SOLN
1500.0000 mg | Freq: Once | INTRAVENOUS | Status: AC
  Administered 2020-04-04: 1500 mg via INTRAVENOUS
  Filled 2020-04-04: qty 300

## 2020-04-04 MED ORDER — PROPOFOL 10 MG/ML IV BOLUS
INTRAVENOUS | Status: DC | PRN
  Administered 2020-04-04: 200 mg via INTRAVENOUS

## 2020-04-04 MED ORDER — TRANEXAMIC ACID-NACL 1000-0.7 MG/100ML-% IV SOLN
INTRAVENOUS | Status: DC | PRN
Start: 2020-04-04 — End: 2020-04-04
  Administered 2020-04-04: 1000 mg via INTRAVENOUS

## 2020-04-04 MED ORDER — METOCLOPRAMIDE HCL 5 MG PO TABS: 5.0000 mg | ORAL_TABLET | Freq: Three times a day (TID) | ORAL | Status: DC | PRN

## 2020-04-04 MED ORDER — OXYCODONE HCL 5 MG/5ML PO SOLN: 5.0000 mg | Freq: Once | ORAL | Status: DC | PRN

## 2020-04-04 MED ORDER — METOCLOPRAMIDE HCL 5 MG/ML IJ SOLN: 5.0000 mg | Freq: Three times a day (TID) | INTRAMUSCULAR | Status: DC | PRN

## 2020-04-04 MED ORDER — ONDANSETRON HCL 4 MG/2ML IJ SOLN
INTRAMUSCULAR | Status: DC | PRN
  Administered 2020-04-04: 4 mg via INTRAVENOUS

## 2020-04-04 MED ORDER — OXYCODONE HCL 5 MG PO TABS: 5.0000 mg | ORAL_TABLET | Freq: Once | ORAL | Status: DC | PRN

## 2020-04-04 MED ORDER — FENTANYL CITRATE (PF) 100 MCG/2ML IJ SOLN
25.0000 ug | INTRAMUSCULAR | Status: DC | PRN
  Administered 2020-04-04 (×3): 50 ug via INTRAVENOUS

## 2020-04-04 MED ORDER — DOCUSATE SODIUM 100 MG PO CAPS
100.0000 mg | ORAL_CAPSULE | Freq: Two times a day (BID) | ORAL | Status: DC
  Administered 2020-04-04 – 2020-04-07 (×7): 100 mg via ORAL
  Filled 2020-04-04 (×7): qty 1

## 2020-04-04 MED ORDER — SODIUM CHLORIDE 0.9 % IV SOLN: INTRAVENOUS | Status: DC

## 2020-04-04 MED ORDER — AMLODIPINE BESYLATE 5 MG PO TABS
5.0000 mg | ORAL_TABLET | Freq: Every day | ORAL | Status: DC
  Administered 2020-04-05 – 2020-04-07 (×3): 5 mg via ORAL
  Filled 2020-04-04 (×3): qty 1

## 2020-04-04 MED ORDER — LIDOCAINE 2% (20 MG/ML) 5 ML SYRINGE
INTRAMUSCULAR | Status: AC
  Filled 2020-04-04: qty 10

## 2020-04-04 MED ORDER — MIDAZOLAM HCL 2 MG/2ML IJ SOLN
INTRAMUSCULAR | Status: AC
  Filled 2020-04-04: qty 2

## 2020-04-04 MED ORDER — LACTATED RINGERS IV SOLN: INTRAVENOUS | Status: DC

## 2020-04-04 MED ORDER — PRAVASTATIN SODIUM 40 MG PO TABS
40.0000 mg | ORAL_TABLET | Freq: Every day | ORAL | Status: DC
  Administered 2020-04-05 – 2020-04-07 (×3): 40 mg via ORAL
  Filled 2020-04-04 (×3): qty 1

## 2020-04-04 MED ORDER — CHLORHEXIDINE GLUCONATE 0.12 % MT SOLN
OROMUCOSAL | Status: AC
  Administered 2020-04-04: 15 mL via OROMUCOSAL
  Filled 2020-04-04: qty 15

## 2020-04-04 MED ORDER — HYDROMORPHONE HCL 1 MG/ML IJ SOLN: 0.5000 mg | INTRAMUSCULAR | Status: DC | PRN

## 2020-04-04 MED ORDER — IRBESARTAN 150 MG PO TABS
150.0000 mg | ORAL_TABLET | Freq: Every day | ORAL | Status: DC
  Administered 2020-04-05 – 2020-04-07 (×3): 150 mg via ORAL
  Filled 2020-04-04 (×3): qty 1

## 2020-04-04 MED ORDER — FENTANYL CITRATE (PF) 250 MCG/5ML IJ SOLN
INTRAMUSCULAR | Status: AC
  Filled 2020-04-04: qty 5

## 2020-04-04 MED ORDER — 0.9 % SODIUM CHLORIDE (POUR BTL) OPTIME
TOPICAL | Status: DC | PRN
  Administered 2020-04-04: 1000 mL

## 2020-04-04 MED ORDER — ONDANSETRON HCL 4 MG PO TABS: 4.0000 mg | ORAL_TABLET | Freq: Four times a day (QID) | ORAL | Status: DC | PRN

## 2020-04-04 MED ORDER — VANCOMYCIN HCL IN DEXTROSE 1-5 GM/200ML-% IV SOLN
1000.0000 mg | Freq: Two times a day (BID) | INTRAVENOUS | Status: DC
  Administered 2020-04-05 – 2020-04-06 (×4): 1000 mg via INTRAVENOUS
  Filled 2020-04-04 (×5): qty 200

## 2020-04-04 MED ORDER — KETOROLAC TROMETHAMINE 30 MG/ML IJ SOLN
INTRAMUSCULAR | Status: AC
  Filled 2020-04-04: qty 1

## 2020-04-04 MED ORDER — ONDANSETRON HCL 4 MG/2ML IJ SOLN: 4.0000 mg | Freq: Once | INTRAMUSCULAR | Status: DC | PRN

## 2020-04-04 MED ORDER — LIDOCAINE 2% (20 MG/ML) 5 ML SYRINGE
INTRAMUSCULAR | Status: DC | PRN
  Administered 2020-04-04: 60 mg via INTRAVENOUS

## 2020-04-04 MED ORDER — CEFAZOLIN SODIUM-DEXTROSE 2-4 GM/100ML-% IV SOLN
2.0000 g | INTRAVENOUS | Status: AC
  Administered 2020-04-04: 2 g via INTRAVENOUS

## 2020-04-04 MED ORDER — HYDROMORPHONE HCL 1 MG/ML IJ SOLN
INTRAMUSCULAR | Status: AC
  Filled 2020-04-04: qty 1

## 2020-04-04 MED ORDER — ONDANSETRON HCL 4 MG/2ML IJ SOLN: 4.0000 mg | Freq: Four times a day (QID) | INTRAMUSCULAR | Status: DC | PRN

## 2020-04-04 MED ORDER — HYDROMORPHONE HCL 1 MG/ML IJ SOLN
INTRAMUSCULAR | Status: DC | PRN
  Administered 2020-04-04: 1 mg via INTRAVENOUS

## 2020-04-04 MED ORDER — ACETAMINOPHEN 10 MG/ML IV SOLN
INTRAVENOUS | Status: AC
  Filled 2020-04-04: qty 100

## 2020-04-04 MED ORDER — ORAL CARE MOUTH RINSE: 15.0000 mL | Freq: Once | OROMUCOSAL | Status: AC

## 2020-04-04 MED ORDER — DEXAMETHASONE SODIUM PHOSPHATE 10 MG/ML IJ SOLN
INTRAMUSCULAR | Status: DC | PRN
  Administered 2020-04-04: 10 mg via INTRAVENOUS

## 2020-04-04 MED ORDER — ONDANSETRON HCL 4 MG/2ML IJ SOLN
INTRAMUSCULAR | Status: AC
  Filled 2020-04-04: qty 4

## 2020-04-04 MED ORDER — FENTANYL CITRATE (PF) 100 MCG/2ML IJ SOLN
INTRAMUSCULAR | Status: DC | PRN
  Administered 2020-04-04 (×2): 50 ug via INTRAVENOUS
  Administered 2020-04-04: 100 ug via INTRAVENOUS
  Administered 2020-04-04: 50 ug via INTRAVENOUS

## 2020-04-04 MED ORDER — CHLORHEXIDINE GLUCONATE 0.12 % MT SOLN: 15.0000 mL | Freq: Once | OROMUCOSAL | Status: AC

## 2020-04-04 MED ORDER — KETOROLAC TROMETHAMINE 30 MG/ML IJ SOLN
INTRAMUSCULAR | Status: DC | PRN
Start: 2020-04-04 — End: 2020-04-04
  Administered 2020-04-04: 30 mg via INTRAVENOUS

## 2020-04-04 MED ORDER — OXYCODONE HCL 5 MG PO TABS
5.0000 mg | ORAL_TABLET | ORAL | Status: DC | PRN
  Administered 2020-04-04 – 2020-04-05 (×2): 10 mg via ORAL
  Administered 2020-04-05: 5 mg via ORAL
  Administered 2020-04-05: 10 mg via ORAL
  Administered 2020-04-06: 5 mg via ORAL
  Administered 2020-04-06 (×3): 10 mg via ORAL
  Filled 2020-04-04 (×9): qty 2

## 2020-04-04 MED ORDER — MIDAZOLAM HCL 5 MG/5ML IJ SOLN
INTRAMUSCULAR | Status: DC | PRN
  Administered 2020-04-04: 2 mg via INTRAVENOUS

## 2020-04-04 MED ORDER — ACETAMINOPHEN 325 MG PO TABS
325.0000 mg | ORAL_TABLET | Freq: Four times a day (QID) | ORAL | Status: DC | PRN
  Administered 2020-04-05 – 2020-04-07 (×4): 650 mg via ORAL
  Filled 2020-04-04 (×4): qty 2

## 2020-04-04 SURGICAL SUPPLY — 38 items
BLADE SURG 21 STRL SS (BLADE) ×2 IMPLANT
BNDG COHESIVE 6X5 TAN STRL LF (GAUZE/BANDAGES/DRESSINGS) ×1 IMPLANT
BNDG ELASTIC 6X5.8 VLCR STR LF (GAUZE/BANDAGES/DRESSINGS) ×1 IMPLANT
BNDG GAUZE ELAST 4 BULKY (GAUZE/BANDAGES/DRESSINGS) ×3 IMPLANT
COVER SURGICAL LIGHT HANDLE (MISCELLANEOUS) ×4 IMPLANT
COVER WAND RF STERILE (DRAPES) IMPLANT
DRAPE U-SHAPE 47X51 STRL (DRAPES) ×2 IMPLANT
DRSG ADAPTIC 3X8 NADH LF (GAUZE/BANDAGES/DRESSINGS) ×2 IMPLANT
DRSG PAD ABDOMINAL 8X10 ST (GAUZE/BANDAGES/DRESSINGS) ×1 IMPLANT
DURAPREP 26ML APPLICATOR (WOUND CARE) ×2 IMPLANT
ELECT REM PT RETURN 9FT ADLT (ELECTROSURGICAL)
ELECTRODE REM PT RTRN 9FT ADLT (ELECTROSURGICAL) IMPLANT
EVACUATOR 1/8 PVC DRAIN (DRAIN) ×1 IMPLANT
GAUZE SPONGE 4X4 12PLY STRL (GAUZE/BANDAGES/DRESSINGS) ×2 IMPLANT
GLOVE BIOGEL PI IND STRL 9 (GLOVE) ×1 IMPLANT
GLOVE BIOGEL PI INDICATOR 9 (GLOVE) ×1
GLOVE SURG ORTHO 9.0 STRL STRW (GLOVE) ×2 IMPLANT
GOWN STRL REUS W/ TWL XL LVL3 (GOWN DISPOSABLE) ×2 IMPLANT
GOWN STRL REUS W/TWL XL LVL3 (GOWN DISPOSABLE) ×4
HANDPIECE INTERPULSE COAX TIP (DISPOSABLE) ×2
KIT BASIN OR (CUSTOM PROCEDURE TRAY) ×2 IMPLANT
KIT TURNOVER KIT B (KITS) ×2 IMPLANT
MANIFOLD NEPTUNE II (INSTRUMENTS) ×2 IMPLANT
NS IRRIG 1000ML POUR BTL (IV SOLUTION) ×2 IMPLANT
PACK ORTHO EXTREMITY (CUSTOM PROCEDURE TRAY) ×2 IMPLANT
PAD ARMBOARD 7.5X6 YLW CONV (MISCELLANEOUS) ×4 IMPLANT
SET HNDPC FAN SPRY TIP SCT (DISPOSABLE) IMPLANT
STAPLER VISISTAT 35W (STAPLE) ×1 IMPLANT
STOCKINETTE IMPERVIOUS 9X36 MD (GAUZE/BANDAGES/DRESSINGS) IMPLANT
SUT ETHILON 2 0 PSLX (SUTURE) ×2 IMPLANT
SUT VIC AB 1 CTX 27 (SUTURE) ×1 IMPLANT
SUT VIC AB 2-0 CT1 27 (SUTURE) ×2
SUT VIC AB 2-0 CT1 TAPERPNT 27 (SUTURE) IMPLANT
SWAB COLLECTION DEVICE MRSA (MISCELLANEOUS) ×2 IMPLANT
SWAB CULTURE ESWAB REG 1ML (MISCELLANEOUS) IMPLANT
TOWEL GREEN STERILE (TOWEL DISPOSABLE) ×2 IMPLANT
TUBE CONNECTING 12X1/4 (SUCTIONS) ×2 IMPLANT
YANKAUER SUCT BULB TIP NO VENT (SUCTIONS) ×2 IMPLANT

## 2020-04-04 NOTE — Progress Notes (Signed)
Received pt from PACU via strecher accompanied by staff, Pt is alert.oriented in no apparent distress. Situated/orientated to room/equipments. Pt's guide/menu provided/discussed  With pt./significant other. Pt has been informed that facility is not responsible for any losses/damages to any personal belongings/valuables for the entire duration of his stay.Pt verbalized understanding and no complaints. Voiced. 3 side rails up and call bell/room phone within reach.Bed in lowest position and all wheels locked.

## 2020-04-04 NOTE — H&P (Signed)
Eric Murray is an 59 y.o. male.   Chief Complaint: left Knee Pain HPI: Patient is a 59 year old gentleman with rheumatoid arthritis he is currently on methotrexate and Humira he has been on prednisone he complains of painful swelling of the left knee he is status post left knee arthroscopic debridement last year and subsequently has developed a painful swelling over the past 4 weeks.. Past Medical History:  Diagnosis Date  . Arthritis   . Benign essential HTN   . Colon polyps    TA 2009  . History of kidney stones   . Hyperlipidemia   . Rheumatoid arthritis [facility])     Past Surgical History:  Procedure Laterality Date  . arm fracture surgery     as child   . COLONOSCOPY    . HERNIA REPAIR    . MENISCUS REPAIR Left   . POLYPECTOMY      Family History  Problem Relation Age of Onset  . Cataracts Mother 60       Pt died of old age  . Pancreatic cancer Father 73  . Prostate cancer Father   . Breast cancer Sister   . Cushing syndrome Brother   . Colon cancer Neg Hx   . Colon polyps Neg Hx    Social History:  reports that he quit smoking about a year ago. His smoking use included cigarettes. He smoked 1.00 pack per day. He has never used smokeless tobacco. He reports current alcohol use of about 18.0 standard drinks of alcohol per week. He reports that he does not use drugs.  Allergies: No Known Allergies  No medications prior to admission.    Results for orders placed or performed during the hospital encounter of 04/03/20 (from the past 48 hour(s))  SARS CORONAVIRUS 2 (TAT 6-24 HRS) Nasopharyngeal Nasopharyngeal Swab     Status: None   Collection Time: 04/03/20 11:59 AM   Specimen: Nasopharyngeal Swab  Result Value Ref Range   SARS Coronavirus 2 NEGATIVE NEGATIVE    Comment: (NOTE) SARS-CoV-2 target nucleic acids are NOT DETECTED.  The SARS-CoV-2 RNA is generally detectable in upper and lower respiratory specimens during the acute phase of infection.  Negative results do not preclude SARS-CoV-2 infection, do not rule out co-infections with other pathogens, and should not be used as the sole basis for treatment or other patient management decisions. Negative results must be combined with clinical observations, patient history, and epidemiological information. The expected result is Negative.  Fact Sheet for Patients: SugarRoll.be  Fact Sheet for Healthcare Providers: https://www.woods-mathews.com/  This test is not yet approved or cleared by the [place] FDA and  has been authorized for detection and/or diagnosis of SARS-CoV-2 by FDA under an Emergency Use Authorization (EUA). This EUA will remain  in effect (meaning this test can be used) for the duration of the COVID-19 declaration under Se ction 564(b)(1) of the Act, 21 U.S.C. section 360bbb-3(b)(1), unless the authorization is terminated or revoked sooner.  Performed at [facility], [place] [address] [place], [place] [address]    No results found.  Review of Systems  All other systems reviewed and are negative.   There were no vitals taken for this visit. Physical Exam  Patient is alert, oriented, no adenopathy, well-dressed, normal affect, normal respiratory effort. Semination patient does have an antalgic gait he is not on antibiotics arthroscopy was last year.  He states he is having 1 month acute swelling.  He denies any history of gout denies a  history of psoriatic arthritis.  Examination of the knee there is no redness no cellulitis there is a moderate effusion.  After informed consent the knee was aspirated of cloudy serosanguineous fluid 15 cc.Heart RRR Lungs Clear Assessment/Plan Plan: Aspirate fluid left knee is sent for Gram stain culture and cell count.  We will review the MRI scan tomorrow and most likely will post patient for surgery on Wednesday for synovectomy of the left knee.  Bevely Palmer Fleet Higham,  PA 04/04/2020, 5:58 AM

## 2020-04-04 NOTE — Anesthesia Preprocedure Evaluation (Signed)
Anesthesia Evaluation  Patient identified by MRN, date of birth, ID band Patient awake    Reviewed: Allergy & Precautions, NPO status , Patient's Chart, lab work & pertinent test results  History of Anesthesia Complications Negative for: history of anesthetic complications  Airway Mallampati: II  TM Distance: >3 FB Neck ROM: Full    Dental  (+) Teeth Intact   Pulmonary neg pulmonary ROS, former smoker,    Pulmonary exam normal        Cardiovascular hypertension, Normal cardiovascular exam  HLD   Neuro/Psych negative neurological ROS  negative psych ROS   GI/Hepatic negative GI ROS, Neg liver ROS,   Endo/Other  negative endocrine ROS  Renal/GU negative Renal ROS  negative genitourinary   Musculoskeletal  (+) Arthritis  (on humira/methotrexate), Rheumatoid disorders,    Abdominal   Peds  Hematology negative hematology ROS (+)   Anesthesia Other Findings  Echo 2017:   Blood pressure demonstrated a normal response to exercise.  There was no ST segment deviation noted during stress.  Normal exercise stress test with no EKG criteria of ischemia.  Normal exercise tolerance. The patient exercised to 11.9 mets.  This is a low risk study.     Reproductive/Obstetrics                            Anesthesia Physical Anesthesia Plan  ASA: II  Anesthesia Plan: General   Post-op Pain Management:    Induction: Intravenous  PONV Risk Score and Plan: 2 and Ondansetron, Dexamethasone, Midazolam and Treatment may vary due to age or medical condition  Airway Management Planned: LMA  Additional Equipment: None  Intra-op Plan:   Post-operative Plan: Extubation in OR  Informed Consent: I have reviewed the patients History and Physical, chart, labs and discussed the procedure including the risks, benefits and alternatives for the proposed anesthesia with the patient or authorized  representative who has indicated his/her understanding and acceptance.     Dental advisory given  Plan Discussed with:   Anesthesia Plan Comments:         Anesthesia Quick Evaluation

## 2020-04-04 NOTE — Evaluation (Signed)
Physical Therapy Evaluation Patient Details Name: Eric Murray MRN: [id number] DOB: 09-May-1961 Today's Date: 04/04/2020   History of Present Illness  Pt is a 59 y.o. male admitted 04/04/20 with septic L knee, s/p L knee synovectomy and hemovac drain placement. PMH includes RA, HTN, L knee arthroscopic debridement (2020).    Clinical Impression  Pt presents with an overall decrease in functional mobility secondary to above. PTA, pt independent and lives with fiance. Educ on precautions, positioning, therex, and importance of mobility. Today, pt able to initiate transfer and gait training with RW, moving well despite c/o L knee pain, decreased knee/hip muscle activation and ROM. Pt would benefit from continued acute PT services to maximize functional mobility and independence prior to d/c with outpatient orthopedic PT services.     Follow Up Recommendations Outpatient PT;Supervision - Intermittent    Equipment Recommendations  Rolling walker with 5" wheels    Recommendations for Other Services       Precautions / Restrictions Precautions Precautions: Fall;Other (comment) Precaution Comments: L knee hemovac Restrictions Weight Bearing Restrictions: Yes LLE Weight Bearing: Weight bearing as tolerated      Mobility  Bed Mobility Overal bed mobility: Needs Assistance Bed Mobility: Supine to Sit     Supine to sit: Min assist;HOB elevated     General bed mobility comments: MinA for LLE management  Transfers Overall transfer level: Needs assistance Equipment used: Rolling walker (2 wheeled) Transfers: Sit to/from Stand Sit to Stand: Min guard;Supervision         General transfer comment: Initial min guard, progressing to supervision on subsequent trials  Ambulation/Gait Ambulation/Gait assistance: Min guard;Supervision Gait Distance (Feet): 60 Feet Assistive device: Rolling walker (2 wheeled) Gait Pattern/deviations: Step-to pattern;Decreased weight shift to  left;Trunk flexed;Antalgic Gait velocity: Decreased   General Gait Details: Slow, antalgic gait with RW and intermittent min guard, progressing to supervision-level. Cues for increased LLE WBAT  Stairs            Wheelchair Mobility    Modified Rankin (Stroke Patients Only)       Balance Overall balance assessment: Needs assistance   Sitting balance-Leahy Scale: Good Sitting balance - Comments: Able to reach bilateral feet     Standing balance-Leahy Scale: Fair Standing balance comment: Can static stand without UE support but minimal WB through LLE                             Pertinent Vitals/Pain Pain Assessment: Faces Faces Pain Scale: Hurts little more Pain Location: L knee, especially with flexion Pain Descriptors / Indicators: Discomfort;Guarding Pain Intervention(s): Monitored during session    Home Living Family/patient expects to be discharged to:: Private residence Living Arrangements: Spouse/significant other Available Help at Discharge: Family;Available PRN/intermittently Type of Home: House Home Access: Stairs to enter Entrance Stairs-Rails: None (or 4 steps w/ rail) Entrance Stairs-Number of Steps: 1 Home Layout: One level Home Equipment: Shower seat Additional Comments: Fiance works, but can take time off to assist if needed    Prior Function Level of Independence: Independent         Comments: Limited mobility secondary to prolonged L knee pain, but ambulating independently and indep with ADLs     Hand Dominance        Extremity/Trunk Assessment   Upper Extremity Assessment Upper Extremity Assessment: Overall WFL for tasks assessed    Lower Extremity Assessment Lower Extremity Assessment: LLE deficits/detail LLE Deficits / Details: hip and knee flex/ext  strength functionally <3/5 LLE: Unable to fully assess due to pain;Unable to fully assess due to immobilization LLE Coordination: decreased gross motor    Cervical /  Trunk Assessment Cervical / Trunk Assessment: Normal  Communication   Communication: No difficulties  Cognition Arousal/Alertness: Awake/alert Behavior During Therapy: WFL for tasks assessed/performed Overall Cognitive Status: Within Functional Limits for tasks assessed                                        General Comments General comments (skin integrity, edema, etc.): Fiance present and supportive    Exercises Other Exercises Other Exercises: Muscle activation noted with attempts at LAQ and seated hip flex, but pt unable to complete; able to perform repeated heel/toe raises while seated   Assessment/Plan    PT Assessment Patient needs continued PT services  PT Problem List Decreased strength;Decreased range of motion;Decreased activity tolerance;Decreased balance;Decreased mobility;Decreased knowledge of use of DME;Decreased knowledge of precautions;Pain       PT Treatment Interventions DME instruction;Gait training;Stair training;Functional mobility training;Therapeutic activities;Therapeutic exercise;Balance training;Patient/family education    PT Goals (Current goals can be found in the Care Plan section)  Acute Rehab PT Goals Patient Stated Goal: Be able to bend knee again PT Goal Formulation: With patient Time For Goal Achievement: 04/18/20 Potential to Achieve Goals: Good    Frequency Min 5X/week   Barriers to discharge        Co-evaluation               AM-PAC PT "6 Clicks" Mobility  Outcome Measure Help needed turning from your back to your side while in a flat bed without using bedrails?: A Little Help needed moving from lying on your back to sitting on the side of a flat bed without using bedrails?: A Little Help needed moving to and from a bed to a chair (including a wheelchair)?: None Help needed standing up from a chair using your arms (e.g., wheelchair or bedside chair)?: None Help needed to walk in hospital room?: A Little Help  needed climbing 3-5 steps with a railing? : A Little 6 Click Score: 20    End of Session   Activity Tolerance: Patient tolerated treatment well Patient left: in chair;with call bell/phone within reach;with chair alarm set;with family/visitor present Nurse Communication: Mobility status PT Visit Diagnosis: Other abnormalities of gait and mobility (R26.89);Pain Pain - Right/Left: Left Pain - part of body: Knee    Time: 0962-8366 PT Time Calculation (min) (ACUTE ONLY): 26 min   Charges:   PT Evaluation $PT Eval Low Complexity: 1 Low        Mabeline Caras, PT, DPT Acute Rehabilitation Services  Pager [phone] Office Reading 04/04/2020, 5:27 PM

## 2020-04-04 NOTE — Anesthesia Postprocedure Evaluation (Signed)
Anesthesia Post Note  Patient: Eric Murray  Procedure(s) Performed: LEFT KNEE OPEN DEBRIDEMENT (Left )     Patient location during evaluation: PACU Anesthesia Type: General Level of consciousness: awake and alert Pain management: pain level controlled Vital Signs Assessment: post-procedure vital signs reviewed and stable Respiratory status: spontaneous breathing, nonlabored ventilation and respiratory function stable Cardiovascular status: blood pressure returned to baseline and stable Postop Assessment: no apparent nausea or vomiting Anesthetic complications: no   No complications documented.  Last Vitals:  Vitals:   04/04/20 1430 04/04/20 1519  BP:  108/78  Pulse:  67  Resp:  18  Temp: 36.5 C 36.5 C  SpO2:  95%    Last Pain:  Vitals:   04/04/20 1519  TempSrc: Oral  PainSc:                  Lidia Collum

## 2020-04-04 NOTE — Anesthesia Procedure Notes (Signed)
Procedure Name: LMA Insertion Date/Time: 04/04/2020 11:28 AM Performed by: Babs Bertin, CRNA Pre-anesthesia Checklist: Patient identified, Emergency Drugs available, Suction available and Patient being monitored Patient Re-evaluated:Patient Re-evaluated prior to induction Oxygen Delivery Method: Circle System Utilized Preoxygenation: Pre-oxygenation with 100% oxygen Induction Type: IV induction Ventilation: Mask ventilation without difficulty LMA: LMA inserted LMA Size: 4.0 Number of attempts: 1 Airway Equipment and Method: Bite block Placement Confirmation: positive ETCO2 Tube secured with: Tape Dental Injury: Teeth and Oropharynx as per pre-operative assessment

## 2020-04-04 NOTE — Consult Note (Signed)
[facility] for Infectious Disease    Date of Admission:  04/04/2020     Total days of antibiotics 1               Reason for Consult: Septic Arthritis Left Knee   Referring Provider: Sharol Given Primary Care Provider: Leanna Battles, MD   ASSESSMENT:  Eric Murray has septic arthritis of the left knee s/p debridement on 7/28. Specimens with gram stain and cultures pending. Will start broad spectrum coverage with vancomycin and ceftriaxone. Must also give consideration to possible fungal or atypical infection given history of RA and immune suppression. Will check baseline inflammatory markers. Likely to need course of 4 weeks antibiotic therapy and PICC line placement.   PLAN:  1. Start vancomycin and ceftriaxone.  2. Monitor renal function and vancomycin levels. 3. Wound management per Dr. Sharol Given.  4. Monitor surgical specimens and narrow antibiotics as able. 5. Check Hepatitis C antibody for routine screening.  6. Check inflammatory markers for baseline of treatment.    Active Problems:   Pyogenic arthritis of left knee joint (HCC)   Septic infrapatellar bursitis of left knee   . fentaNYL      . fentaNYL         HPI: Eric Murray is a 59 y.o. male with previous medical history of rheumatoid arthritis on Humira and methotrexate, hyperlipidemia, hypertension, and arthritis presenting for surgical intervention of left knee septic arthritis.  Eric Murray initially underwent meniscal surgery approximately 1 year ago and has had 2 cortisone injections with the most recent being about 6 to 8 weeks ago.  He began having worsening left knee pain and swelling over the past 4 weeks.  Seen by Dr. Sharol Given in the clinic having had previous synovial biopsy on 5/21 which showed inflammation.  Brought to the operating room today for left knee open synovectomy with synovial tissue sent for cultures and Hemovac drain placement.  Visualization showed extensive synovitis with cystic  changes throughout the synovial lining which was extensively debrided.  Synovial fluid was sent for anaerobic/aerobic culture which is currently pending.  Eric Murray was seen in the PACU following surgery. Has been afebrile and not on any antibiotics recently. ID asked to make treatment recommendations.   Review of Systems: Review of Systems  Constitutional: Negative for chills, fever and weight loss.  Respiratory: Negative for cough, shortness of breath and wheezing.   Cardiovascular: Negative for chest pain and leg swelling.  Gastrointestinal: Negative for abdominal pain, constipation, diarrhea, nausea and vomiting.  Musculoskeletal:       Positive for left knee pain.   Skin: Negative for rash.     Past Medical History:  Diagnosis Date  . Arthritis   . Benign essential HTN   . Colon polyps    TA 2009  . History of kidney stones   . Hyperlipidemia   . Rheumatoid arthritis ([place])     Social History   Tobacco Use  . Smoking status: Former Smoker    Packs/day: 1.00    Types: Cigarettes    Quit date: 04/04/2019    Years since quitting: 1.0  . Smokeless tobacco: Never Used  Vaping Use  . Vaping Use: Every day  Substance Use Topics  . Alcohol use: Yes    Alcohol/week: 18.0 standard drinks    Types: 18 Cans of beer per week    Comment: mostly weekends  . Drug use: No    Family History  Problem Relation Age of Onset  .  Cataracts Mother 67       Pt died of old age  . Pancreatic cancer Father 25  . Prostate cancer Father   . Breast cancer Sister   . Cushing syndrome Brother   . Colon cancer Neg Hx   . Colon polyps Neg Hx     No Known Allergies  OBJECTIVE: Blood pressure (!) 81/69, pulse 84, temperature 97.7 F (36.5 C), resp. rate 12, height 5\' 11"  (1.803 m), weight 88 kg, SpO2 96 %.  Physical Exam Constitutional:      General: He is not in acute distress.    Appearance: He is well-developed.     Comments: Lying in bed with head of bed elevated; pleasant.     Cardiovascular:     Rate and Rhythm: Normal rate and regular rhythm.     Heart sounds: Normal heart sounds.  Pulmonary:     Effort: Pulmonary effort is normal.     Breath sounds: Normal breath sounds.  Skin:    General: Skin is warm and dry.  Neurological:     Mental Status: He is alert.     Lab Results Lab Results  Component Value Date   WBC 7.8 04/04/2020   HGB 11.8 (L) 04/04/2020   HCT 38.1 (L) 04/04/2020   MCV 89.6 04/04/2020   PLT 459 (H) 04/04/2020    Lab Results  Component Value Date   CREATININE 0.79 04/04/2020   BUN 11 04/04/2020   NA 138 04/04/2020   K 4.1 04/04/2020   CL 104 04/04/2020   CO2 22 04/04/2020   No results found for: ALT, AST, GGT, ALKPHOS, BILITOT   Microbiology: Recent Results (from the past 240 hour(s))  SARS CORONAVIRUS 2 (TAT 6-24 HRS) Nasopharyngeal Nasopharyngeal Swab     Status: None   Collection Time: 04/03/20 11:59 AM   Specimen: Nasopharyngeal Swab  Result Value Ref Range Status   SARS Coronavirus 2 NEGATIVE NEGATIVE Final    Comment: (NOTE) SARS-CoV-2 target nucleic acids are NOT DETECTED.  The SARS-CoV-2 RNA is generally detectable in upper and lower respiratory specimens during the acute phase of infection. Negative results do not preclude SARS-CoV-2 infection, do not rule out co-infections with other pathogens, and should not be used as the sole basis for treatment or other patient management decisions. Negative results must be combined with clinical observations, patient history, and epidemiological information. The expected result is Negative.  Fact Sheet for Patients: SugarRoll.be  Fact Sheet for Healthcare Providers: https://www.woods-mathews.com/  This test is not yet approved or cleared by the [place] FDA and  has been authorized for detection and/or diagnosis of SARS-CoV-2 by FDA under an Emergency Use Authorization (EUA). This EUA will remain  in effect (meaning  this test can be used) for the duration of the COVID-19 declaration under Se ction 564(b)(1) of the Act, 21 U.S.C. section 360bbb-3(b)(1), unless the authorization is terminated or revoked sooner.  Performed at [facility], [place] [address] [place], [place] [address]      Terri Piedra, [facility]  04/04/2020  2:48 PM

## 2020-04-04 NOTE — Transfer of Care (Signed)
Immediate Anesthesia Transfer of Care Note  Patient: Eric Murray  Procedure(s) Performed: LEFT KNEE OPEN DEBRIDEMENT (Left )  Patient Location: PACU  Anesthesia Type:General  Level of Consciousness: awake, alert  and oriented  Airway & Oxygen Therapy: Patient Spontanous Breathing and Patient connected to nasal cannula oxygen  Post-op Assessment: Report given to RN and Post -op Vital signs reviewed and stable  Post vital signs: Reviewed and stable  Last Vitals:  Vitals Value Taken Time  BP 111/79 04/04/20 1231  Temp 36.3 C 04/04/20 1227  Pulse 74 04/04/20 1237  Resp 9 04/04/20 1237  SpO2 100 % 04/04/20 1237  Vitals shown include unvalidated device data.  Last Pain:  Vitals:   04/04/20 1025  TempSrc:   PainSc: 0-No pain      Patients Stated Pain Goal: 2 (65/46/50 3546)  Complications: No complications documented.

## 2020-04-04 NOTE — Progress Notes (Signed)
Pharmacy Antibiotic Note  Eric Murray is a 59 y.o. male admitted on 04/04/2020 for surgery on his septic left knee. Pharmacy has been consulted for vancomycin dosing.  Patient has a history of rheumatoid arthritis for which he takes Humira, methotrexate and prednisone. These will be held due to his current infection.   Plan: Vancomycin 1500mg  IV x1  Vancomycin 1000mg  IV q12h  Ceftriaxone 2g IV q24h Follow up cultures for ability to narrow Vancomycin levels as indicated    Height: 5\' 11"  (180.3 cm) Weight: 88 kg (194 lb) IBW/kg (Calculated) : 75.3  Temp (24hrs), Avg:97.7 F (36.5 C), Min:97.4 F (36.3 C), Max:97.9 F (36.6 C)  Recent Labs  Lab 04/04/20 1104  WBC 7.8  CREATININE 0.79    Estimated Creatinine Clearance: 108.5 mL/min (by C-G formula based on SCr of 0.79 mg/dL).    No Known Allergies  Antimicrobials this admission: Vancomycin 7/28>> Ceftriaxone 7/28>>   Microbiology results: 7/28 surgical/wound culture: pending    Thank you for allowing pharmacy to be a part of this patient's care.  Phillis Haggis 04/04/2020 2:50 PM

## 2020-04-04 NOTE — Op Note (Signed)
04/04/2020  12:23 PM  PATIENT:  Eric Murray    PRE-OPERATIVE DIAGNOSIS:  septic left knee  POST-OPERATIVE DIAGNOSIS:  Same  PROCEDURE:  LEFT KNEE OPEN synovectomy both anterior posterior and medial and lateral gutters. Synovial tissue sent for cultures. Hemovac drain placed x1  SURGEON:  Newt Minion, MD  PHYSICIAN ASSISTANT:None ANESTHESIA:   General  PREOPERATIVE INDICATIONS:  [name] is a  59 y.o. male with a diagnosis of septic left knee who failed conservative measures and elected for surgical management.    The risks benefits and alternatives were discussed with the patient preoperatively including but not limited to the risks of infection, bleeding, nerve injury, cardiopulmonary complications, the need for revision surgery, among others, and the patient was willing to proceed.  OPERATIVE IMPLANTS: Hemovac drain medium x1  @ENCIMAGES @  OPERATIVE FINDINGS: Synovial lining was inflamed with cystic changes consistent with infection.  OPERATIVE PROCEDURE: Patient was brought to the operating room and underwent a general anesthetic.  After adequate levels anesthesia were obtained patient's left lower extremity was prepped using DuraPrep draped into a sterile field a timeout was called.  An anterior incision was made over the knee this was carried down to the medial parapatellar retinacular incision.  Visualization showed extensive synovitis with cystic changes throughout the synovial lining.  The synovial lining was extensively debrided anterior medial and lateral gutters and posteriorly.  The wound was irrigated with pulsatile lavage electrocautery was used hemostasis.  The retinaculum was closed using #1 Vicryl a medium Hemovac was placed through the skin retrograde and then extended down through the incision into the joint.  The subcu was then closed with 2-0 Vicryl skin closed with staples sterile dressing was applied patient was extubated taken the PACU in stable  condition   DISCHARGE PLANNING:  Antibiotic duration: Kefzol antibiotics preoperatively start vancomycin postoperatively  Weightbearing: Weightbearing as tolerated  Pain medication: Opioid pathway  Dressing care/ Wound VAC: Hemovac drain charge and record as needed  Ambulatory devices: Walker or crutches  Discharge to: Anticipate discharge with a PICC line for IV antibiotics  Follow-up: In the office 1 week post operative.

## 2020-04-05 ENCOUNTER — Inpatient Hospital Stay: Payer: Self-pay

## 2020-04-05 ENCOUNTER — Other Ambulatory Visit: Payer: Self-pay

## 2020-04-05 ENCOUNTER — Encounter (HOSPITAL_COMMUNITY): Payer: Self-pay | Admitting: Orthopedic Surgery

## 2020-04-05 LAB — SEDIMENTATION RATE: Sed Rate: 68 mm/hr — ABNORMAL HIGH (ref 0–16)

## 2020-04-05 LAB — HEPATITIS C ANTIBODY: HCV Ab: NONREACTIVE

## 2020-04-05 LAB — C-REACTIVE PROTEIN: CRP: 4.1 mg/dL — ABNORMAL HIGH (ref <1.0)

## 2020-04-05 MED ORDER — SODIUM CHLORIDE 0.9% FLUSH: 10.0000 mL | INTRAVENOUS | Status: DC | PRN

## 2020-04-05 MED ORDER — SODIUM CHLORIDE 0.9% FLUSH
10.0000 mL | Freq: Two times a day (BID) | INTRAVENOUS | Status: DC
  Administered 2020-04-06 – 2020-04-07 (×2): 10 mL

## 2020-04-05 MED ORDER — CHLORHEXIDINE GLUCONATE CLOTH 2 % EX PADS
6.0000 | MEDICATED_PAD | Freq: Every day | CUTANEOUS | Status: DC
  Administered 2020-04-05 – 2020-04-07 (×3): 6 via TOPICAL

## 2020-04-05 NOTE — Progress Notes (Signed)
[facility]  Date of Admission:  04/04/2020     Total days of antibiotics 2         ASSESSMENT:  Mr. Read is POD #1 from I&D for pygenic arthritis of the left knee. Surgical specimens with gram stain showing no organisms and cultures pending. Will place PICC line as he will need 4 weeks of IV therapy. Monitor renal function and vancomycin levels while on vancomycin. Plan of care discussed. Continue vancomycin and ceftriaxone. Agree with primary team plan of discharge in next 1-2 days.   PLAN:  1. Continue vancomycin and ceftriaxone. 2. Monitor renal function and vancomycin levels. 3. Monitor cultures and narrow antibiotics as able. 4. PICC line order placed.    Active Problems:   Pyogenic arthritis of left knee joint (HCC)   Septic infrapatellar bursitis of left knee   . amLODipine  5 mg Oral Daily  . docusate sodium  100 mg Oral BID  . irbesartan  150 mg Oral Daily  . pravastatin  40 mg Oral Daily    SUBJECTIVE:   Afebrile with no acute events overnight. Working with physical therapy.   No Known Allergies   Review of Systems: Review of Systems  Constitutional: Negative for chills, fever and weight loss.  Respiratory: Negative for cough, shortness of breath and wheezing.   Cardiovascular: Negative for chest pain and leg swelling.  Gastrointestinal: Negative for abdominal pain, constipation, diarrhea, nausea and vomiting.  Skin: Negative for rash.      OBJECTIVE: Vitals:   04/04/20 2358 04/05/20 0344 04/05/20 0837 04/05/20 1020  BP: 103/72 110/69 109/70 (!) 113/88  Pulse: 86 85 71 67  Resp: 15 14 18    Temp: 97.8 F (36.6 C) (!) 97.4 F (36.3 C) 97.7 F (36.5 C)   TempSrc: Oral Oral Oral   SpO2: 94% 96% 98%   Weight:      Height:       Body mass index is 27.06 kg/m.  Physical Exam Constitutional:      General: He is not in acute distress.    Appearance: He is well-developed.     Comments: Seated on the side of the  bed; pleasant.   Cardiovascular:     Rate and Rhythm: Normal rate and regular rhythm.     Heart sounds: Normal heart sounds.  Pulmonary:     Effort: Pulmonary effort is normal.     Breath sounds: Normal breath sounds.  Musculoskeletal:     Comments: Hemovac patent.   Skin:    General: Skin is warm and dry.  Neurological:     Mental Status: He is alert.  Psychiatric:        Mood and Affect: Mood normal.     Lab Results Lab Results  Component Value Date   WBC 7.8 04/04/2020   HGB 11.8 (L) 04/04/2020   HCT 38.1 (L) 04/04/2020   MCV 89.6 04/04/2020   PLT 459 (H) 04/04/2020    Lab Results  Component Value Date   CREATININE 0.79 04/04/2020   BUN 11 04/04/2020   NA 138 04/04/2020   K 4.1 04/04/2020   CL 104 04/04/2020   CO2 22 04/04/2020   No results found for: ALT, AST, GGT, ALKPHOS, BILITOT   Microbiology: Recent Results (from the past 240 hour(s))  SARS CORONAVIRUS 2 (TAT 6-24 HRS) Nasopharyngeal Nasopharyngeal Swab     Status: None   Collection Time: 04/03/20 11:59 AM   Specimen: Nasopharyngeal Swab  Result Value Ref  Range Status   SARS Coronavirus 2 NEGATIVE NEGATIVE Final    Comment: (NOTE) SARS-CoV-2 target nucleic acids are NOT DETECTED.  The SARS-CoV-2 RNA is generally detectable in upper and lower respiratory specimens during the acute phase of infection. Negative results do not preclude SARS-CoV-2 infection, do not rule out co-infections with other pathogens, and should not be used as the sole basis for treatment or other patient management decisions. Negative results must be combined with clinical observations, patient history, and epidemiological information. The expected result is Negative.  Fact Sheet for Patients: SugarRoll.be  Fact Sheet for Healthcare Providers: https://www.woods-mathews.com/  This test is not yet approved or cleared by the [place] FDA and  has been authorized for detection and/or  diagnosis of SARS-CoV-2 by FDA under an Emergency Use Authorization (EUA). This EUA will remain  in effect (meaning this test can be used) for the duration of the COVID-19 declaration under Se ction 564(b)(1) of the Act, 21 U.S.C. section 360bbb-3(b)(1), unless the authorization is terminated or revoked sooner.  Performed at [facility], [place] [address] [place], [place] [address]   Aerobic/Anaerobic Culture (surgical/deep wound)     Status: None (Preliminary result)   Collection Time: 04/04/20 11:41 AM   Specimen: Wound; Tissue  Result Value Ref Range Status   Specimen Description TISSUE  Final   Special Requests LEFT SYNOVIAL LINING SPEC A  Final   Gram Stain   Final    ABUNDANT WBC PRESENT,BOTH PMN AND MONONUCLEAR NO ORGANISMS SEEN Performed at [facility], [address], [place], [place] [address]    Culture PENDING  Incomplete   Report Status PENDING  Incomplete     Terri Piedra, NP [facility]  04/05/2020  11:12 AM

## 2020-04-05 NOTE — Progress Notes (Signed)
Physical Therapy Treatment Patient Details Name: Eric Murray MRN: [id number] DOB: 1961-08-13 Today's Date: 04/05/2020    History of Present Illness Pt is a 59 y.o. male admitted 04/04/20 with septic L knee, s/p L knee synovectomy and hemovac drain placement. PMH includes RA, HTN, L knee arthroscopic debridement (2020).    PT Comments    Pt supine in bed on arrival.  He reports he has been in the recliner today and wished to return back to bed post session.  Pt is progressing well.  He required assistance to return back to bed due to pain.  Plan for stair training and review of HEP next session. Pt will need HEP issued before returning home.    Follow Up Recommendations  Outpatient PT;Supervision - Intermittent     Equipment Recommendations  Rolling walker with 5" wheels    Recommendations for Other Services       Precautions / Restrictions Precautions Precautions: Fall;Other (comment) Precaution Comments: L knee hemovac Restrictions Weight Bearing Restrictions: Yes LLE Weight Bearing: Weight bearing as tolerated    Mobility  Bed Mobility Overal bed mobility: Needs Assistance Bed Mobility: Supine to Sit;Sit to Supine     Supine to sit: Supervision (with use of gt belt) Sit to supine: Min assist   General bed mobility comments: Min assistance for LLE management back to bed due to pain.  Transfers Overall transfer level: Needs assistance Equipment used: Rolling walker (2 wheeled) Transfers: Sit to/from Stand Sit to Stand: Supervision         General transfer comment: Supervision for safety.  Adjusted RW height for optimal fit.  Ambulation/Gait Ambulation/Gait assistance: Supervision;Min guard Gait Distance (Feet): 80 Feet Assistive device: Rolling walker (2 wheeled) Gait Pattern/deviations: Step-to pattern;Decreased weight shift to left;Trunk flexed;Antalgic;Decreased stance time - left Gait velocity: Decreased   General Gait Details: Slow, antalgic  gait with RW and intermittent min guard, progressing to supervision-level. Cues for increased LLE WBAT   Stairs             Wheelchair Mobility    Modified Rankin (Stroke Patients Only)       Balance Overall balance assessment: Needs assistance Sitting-balance support: Feet supported Sitting balance-Leahy Scale: Good Sitting balance - Comments: Able to reach bilateral feet   Standing balance support: No upper extremity supported Standing balance-Leahy Scale: Fair Standing balance comment: Can static stand without UE support but minimal WB through LLE                            Cognition Arousal/Alertness: Awake/alert Behavior During Therapy: WFL for tasks assessed/performed Overall Cognitive Status: Within Functional Limits for tasks assessed                                        Exercises General Exercises - Lower Extremity Ankle Circles/Pumps: AROM;Both;20 reps;Supine Quad Sets: AROM;Left;10 reps;Supine Heel Slides: AAROM;Left;10 reps;Supine Hip ABduction/ADduction: AAROM;Left;10 reps;Supine Straight Leg Raises: AAROM;Left;10 reps;Supine    General Comments        Pertinent Vitals/Pain Pain Assessment: Faces Faces Pain Scale: Hurts even more Pain Location: L knee Pain Descriptors / Indicators: Discomfort;Guarding Pain Intervention(s): Monitored during session;Repositioned    Home Living                      Prior Function  PT Goals (current goals can now be found in the care plan section) Acute Rehab PT Goals Patient Stated Goal: be independent  Potential to Achieve Goals: Good Progress towards PT goals: Progressing toward goals    Frequency    Min 5X/week      PT Plan Current plan remains appropriate    Co-evaluation              AM-PAC PT "6 Clicks" Mobility   Outcome Measure  Help needed turning from your back to your side while in a flat bed without using bedrails?: A  Little Help needed moving from lying on your back to sitting on the side of a flat bed without using bedrails?: A Little Help needed moving to and from a bed to a chair (including a wheelchair)?: A Little Help needed standing up from a chair using your arms (e.g., wheelchair or bedside chair)?: A Little Help needed to walk in hospital room?: A Little Help needed climbing 3-5 steps with a railing? : A Little 6 Click Score: 18    End of Session Equipment Utilized During Treatment: Gait belt   Patient left: with call bell/phone within reach;with family/visitor present;in bed Nurse Communication: Mobility status PT Visit Diagnosis: Other abnormalities of gait and mobility (R26.89);Pain Pain - Right/Left: Left Pain - part of body: Knee     Time: 6283-6629 PT Time Calculation (min) (ACUTE ONLY): 20 min  Charges:  $Gait Training: 8-22 mins                     [name] , PTA Acute Rehabilitation Services Pager [phone] Office [phone]     [name] [name] 04/05/2020, 4:27 PM

## 2020-04-05 NOTE — Plan of Care (Signed)

## 2020-04-05 NOTE — Telephone Encounter (Signed)
Patient called and left message on voicemail  04-03-20 requesting a note for out of work. He is employed with  [employer] was yesterday.  Several attempts made to get the fax number.  Call go directly nto a general mailbox. Message left again today to return call to provide fax.  Patient said to have note sent to [facility] attention    [address] [place], [place] [address]   [email or web address]  Ph [phone]   Pt's cb  [phone]

## 2020-04-05 NOTE — Telephone Encounter (Signed)
I called and sw pt to advise he can either come and pick up the letter or he can call with fax number. Pt will obtain fax number and call back.

## 2020-04-05 NOTE — Plan of Care (Signed)
  Problem: Education: Goal: Knowledge of General Education information will improve Description: Including pain rating scale, medication(s)/side effects and non-pharmacologic comfort measures Outcome: Progressing   Problem: Health Behavior/Discharge Planning: Goal: Ability to manage health-related needs will improve Outcome: Progressing   Problem: Pain Managment: Goal: General experience of comfort will improve Outcome: Progressing   

## 2020-04-05 NOTE — Progress Notes (Signed)
OT Evaluation   Patient with functional deficits listed below impacting safety and independence with self care. Patient require min A for bed mobility for L LE management (unable to lift or slide along bed). Patient supervision with walker to bathroom with min cues for safety, practice toilet transfer and educate patient on benefits of DME for home. Patient reports a raised toilet seat would be beneficial vs 3 in 1 over toilet due to close proximity of shower. Patient progress to supervision from bathroom to recliner without AD and increased time. Continue to recommend acute OT during hospitalization to maximize patient safety and independence with self care as fiancee works out of home.     04/05/20 1100  OT Visit Information  Last OT Received On 04/05/20  Assistance Needed +1  History of Present Illness Pt is a 59 y.o. male admitted 04/04/20 with septic L knee, s/p L knee synovectomy and hemovac drain placement. PMH includes RA, HTN, L knee arthroscopic debridement (2020).  Precautions  Precautions Fall;Other (comment)  Precaution Comments L knee hemovac  Restrictions  Weight Bearing Restrictions Yes  LLE Weight Bearing WBAT  Home Living  Family/patient expects to be discharged to: Private residence  Living Arrangements Spouse/significant other  Available Help at Discharge Family;Available PRN/intermittently  Type of Home House  Home Access Stairs to enter  Entrance Stairs-Number of Steps 1  Entrance Stairs-Rails None (or 4 steps with rail)  Home Layout One level  Bathroom Chiropractor  Additional Comments Fiance works, but can take time off to assist if needed  Prior Function  Level of Independence Independent  Comments Limited mobility secondary to prolonged L knee pain, but ambulating independently and indep with ADLs  Communication  Communication No difficulties  Pain Assessment  Pain Assessment Faces  Faces  Pain Scale 4  Pain Location L knee  Pain Descriptors / Indicators Discomfort;Guarding  Pain Intervention(s) Monitored during session;Premedicated before session  Cognition  Arousal/Alertness Awake/alert  Behavior During Therapy WFL for tasks assessed/performed  Overall Cognitive Status Within Functional Limits for tasks assessed  Upper Extremity Assessment  Upper Extremity Assessment Overall WFL for tasks assessed  Lower Extremity Assessment  Lower Extremity Assessment Defer to PT evaluation  ADL  Overall ADL's  Needs assistance/impaired  Eating/Feeding Independent  Grooming Oral care;Wash/dry face;Wash/dry hands;Supervision/safety;Standing  Upper Body Bathing Set up;Sitting  Lower Body Bathing Supervison/ safety;Sit to/from stand  Upper Body Dressing  Set up;Sitting  Lower Body Dressing Supervision/safety;Sit to/from stand;Sitting/lateral leans  Lower Body Dressing Details (indicate cue type and reason) seated in recliner patient is able to doff/don L sock with increased time. demonstrated use of reacher/sock aid patient appreciative but feels he will be able to manage. has been using compensatory strategy dressing L LE first at home prior to sx   Toilet Transfer Supervision/safety;Cueing for safety;Comfort height toilet;Ambulation;RW  Toilet Transfer Details (indicate cue type and reason) commode over toilet, educate patient on benefits of raised toilet seat vs 3 in 1 over toilet for home. patient unsure if 3 in 1 will fit due to close proximity of shower  Toileting- Water quality scientist and Hygiene Supervision/safety;Sit to/from stand  Functional mobility during ADLs Supervision/safety;Rolling walker  Bed Mobility  Overal bed mobility Needs Assistance  Bed Mobility Supine to Sit  Supine to sit Min assist  General bed mobility comments MinA for LLE management  Transfers  Overall transfer level Needs assistance  Equipment used Rolling walker (2 wheeled)  Transfers Sit to/from  Stand   Sit to Stand Supervision  General transfer comment patient supervision with min cues for safety initially, patient progress to taking steps from bathroom to recliner without AD with supervision   Balance  Overall balance assessment Needs assistance  Sitting-balance support Feet supported  Sitting balance-Leahy Scale Good  Standing balance support No upper extremity supported  Standing balance-Leahy Scale Fair  OT - End of Session  Equipment Utilized During Treatment Rolling walker  Activity Tolerance Patient tolerated treatment well  Patient left in chair;with call bell/phone within reach;with chair alarm set  Nurse Communication Mobility status  OT Assessment  OT Recommendation/Assessment Patient needs continued OT Services  OT Visit Diagnosis Other abnormalities of gait and mobility (R26.89);Pain  Pain - Right/Left Left  Pain - part of body Knee  OT Problem List Decreased activity tolerance;Impaired balance (sitting and/or standing);Pain;Decreased knowledge of use of DME or AE  OT Plan  OT Frequency (ACUTE ONLY) Min 2X/week  OT Treatment/Interventions (ACUTE ONLY) Self-care/ADL training;DME and/or AE instruction;Therapeutic activities;Patient/family education;Balance training  AM-PAC OT "6 Clicks" Daily Activity Outcome Measure (Version 2)  Help from another person eating meals? 4  Help from another person taking care of personal grooming? 3  Help from another person toileting, which includes using toliet, bedpan, or urinal? 3  Help from another person bathing (including washing, rinsing, drying)? 3  Help from another person to put on and taking off regular upper body clothing? 3  Help from another person to put on and taking off regular lower body clothing? 3  6 Click Score 19  OT Recommendation  Follow Up Recommendations No OT follow up;Supervision - Intermittent  OT Equipment Toilet riser  Individuals Consulted  Consulted and Agree with Results and Recommendations Patient   Acute Rehab OT Goals  Patient Stated Goal be independent   OT Goal Formulation With patient  Time For Goal Achievement 04/19/20  Potential to Achieve Goals Good  OT Time Calculation  OT Start Time (ACUTE ONLY) 0932  OT Stop Time (ACUTE ONLY) 1000  OT Time Calculation (min) 28 min  OT General Charges  $OT Visit 1 Visit  OT Evaluation  $OT Eval Low Complexity 1 Low  OT Treatments  $Self Care/Home Management  8-22 mins  Written Expression  Dominant Hand Right   Delbert Phenix OT OT office: [phone]

## 2020-04-05 NOTE — Progress Notes (Signed)
Patient is postop day 1 status post irrigation and debridement of his knee.  Cultures were sent.  Patient overall feels well he has Hemovac drain in place which is still draining patient reports they empty about 30 cc not that long ago  Vital signs stable afebrile currently no growth with regards to cultures.   Postop day 1 ID consult for antibiotic recommendations plan for discharge in the next 1 to 2 days hopefully the cultures will finalize at that time

## 2020-04-06 ENCOUNTER — Telehealth: Payer: Self-pay | Admitting: Orthopedic Surgery

## 2020-04-06 LAB — BASIC METABOLIC PANEL
Anion gap: 7 (ref 5–15)
BUN: 12 mg/dL (ref 6–20)
CO2: 28 mmol/L (ref 22–32)
Calcium: 8.6 mg/dL — ABNORMAL LOW (ref 8.9–10.3)
Chloride: 106 mmol/L (ref 98–111)
Creatinine, Ser: 0.78 mg/dL (ref 0.61–1.24)
GFR calc Af Amer: >60 mL/min (ref >60)
GFR calc non Af Amer: >60 mL/min (ref >60)
Glucose, Bld: 87 mg/dL (ref 70–99)
Potassium: 4.2 mmol/L (ref 3.5–5.1)
Sodium: 141 mmol/L (ref 135–145)

## 2020-04-06 LAB — VANCOMYCIN, TROUGH: Vancomycin Tr: 11 ug/mL — ABNORMAL LOW (ref 15–20)

## 2020-04-06 MED ORDER — VANCOMYCIN HCL 1250 MG/250ML IV SOLN
1250.0000 mg | Freq: Two times a day (BID) | INTRAVENOUS | Status: DC
  Administered 2020-04-07: 1250 mg via INTRAVENOUS
  Filled 2020-04-06 (×2): qty 250

## 2020-04-06 NOTE — Progress Notes (Signed)
Pharmacy Antibiotic Note  Eric Murray is a 59 y.o. male admitted on 04/04/2020 for surgery on his septic left knee. Pharmacy has been consulted for vancomycin dosing.  Patient has a history of rheumatoid arthritis for which he takes Humira, methotrexate and prednisone. These will be held due to his current infection.   7/30 PM update:  Vancomycin trough is below goal at 11 (goal 15-20)  Plan: Inc vancomycin to 1250 mg IV q12h Cont Ceftriaxone 2g IV q24h F/U renal function closely  Drug levels as needed  Height: 5\' 11"  (180.3 cm) Weight: 88 kg (194 lb) IBW/kg (Calculated) : 75.3  Temp (24hrs), Avg:98 F (36.7 C), Min:97.7 F (36.5 C), Max:98.3 F (36.8 C)  Recent Labs  Lab 04/04/20 1104 04/06/20 0423 04/06/20 2155  WBC 7.8  --   --   CREATININE 0.79 0.78  --   VANCOTROUGH  --   --  11*    Estimated Creatinine Clearance: 108.5 mL/min (by C-G formula based on SCr of 0.78 mg/dL).    No Known Allergies  Antimicrobials this admission: Vancomycin 7/28>> Ceftriaxone 7/28>>   Microbiology results: 7/28 surgical/wound culture: NGTD   Narda Bonds, PharmD, BCPS Clinical Pharmacist Phone: [phone]

## 2020-04-06 NOTE — Progress Notes (Signed)
[facility]  Date of Admission:  04/04/2020     Total days of antibiotics 3         ASSESSMENT:  Eric Murray cultures remain without growth to date. Continues to progress. PICC line placed. Will plan for 4 weeks of vancomycin and ceftriaxone. OPAT and Home Health orders placed. Therapy end date scheduled for 8/25. Continue wound care per orthopedic surgery. Follow up in the ID clinic on 8/20.   PLAN:  1. Continue vancomycin and ceftriaxone. 2. Monitor cultures for organism identification if found. 3. OPAT and Home Health orders below.  4. Follow up in the ID clinic.   Active Problems:   Pyogenic arthritis of left knee joint (HCC)   Septic infrapatellar bursitis of left knee   Diagnosis: Septic arthritis of the left knee  Culture Result: Culture negative  No Known Allergies  OPAT Orders Discharge antibiotics to be given via PICC line Discharge antibiotics: vancomycin and ceftriaxone Per pharmacy protocol  Aim for Vancomycin trough 15-20 or AUC 400-550 (unless otherwise indicated) Duration:  End Date:  [facility] Care Per Protocol:  Home health RN for IV administration and teaching; PICC line care and labs.    Labs weekly while on IV antibiotics: _X_ CBC with differential _X_ BMP (BIWEEKLY) _X_ CRP _X_ ESR _X_ Vancomycin trough   X__ Please pull PIC at completion of IV antibiotics __ Please leave PIC in place until doctor has seen patient or been notified  Fax weekly labs to [phone]  Clinic Follow Up Appt:  8/20 at 10:30 am with Terri Piedra, NP  @    . amLODipine  5 mg Oral Daily  . Chlorhexidine Gluconate Cloth  6 each Topical Daily  . docusate sodium  100 mg Oral BID  . irbesartan  150 mg Oral Daily  . pravastatin  40 mg Oral Daily  . sodium chloride flush  10-40 mL Intracatheter Q12H    SUBJECTIVE:  Afebrile overnight with no acute events. Ready to go home when able.  No Known Allergies   Review of  Systems: Review of Systems  Constitutional: Negative for chills, fever and weight loss.  Respiratory: Negative for cough, shortness of breath and wheezing.   Cardiovascular: Negative for chest pain and leg swelling.  Gastrointestinal: Negative for abdominal pain, constipation, diarrhea, nausea and vomiting.  Skin: Negative for rash.      OBJECTIVE: Vitals:   04/05/20 1144 04/05/20 2022 04/06/20 0334 04/06/20 0755  BP: 106/77 125/81 118/78 119/82  Pulse: 67 81 66 67  Resp: 20 16 16 18   Temp: 98.3 F (36.8 C) 98.2 F (36.8 C) 97.8 F (36.6 C) 98.3 F (36.8 C)  TempSrc: Oral Oral Oral Oral  SpO2: 98% 96% 95% 97%  Weight:      Height:       Body mass index is 27.06 kg/m.  Physical Exam Constitutional:      General: He is not in acute distress.    Appearance: He is well-developed.  Cardiovascular:     Rate and Rhythm: Normal rate and regular rhythm.     Heart sounds: Normal heart sounds.  Pulmonary:     Effort: Pulmonary effort is normal.     Breath sounds: Normal breath sounds.  Skin:    General: Skin is warm and dry.  Neurological:     Mental Status: He is alert and oriented to person, place, and time.  Psychiatric:        Behavior: Behavior normal.  Thought Content: Thought content normal.        Judgment: Judgment normal.     Lab Results Lab Results  Component Value Date   WBC 7.8 04/04/2020   HGB 11.8 (L) 04/04/2020   HCT 38.1 (L) 04/04/2020   MCV 89.6 04/04/2020   PLT 459 (H) 04/04/2020    Lab Results  Component Value Date   CREATININE 0.78 04/06/2020   BUN 12 04/06/2020   NA 141 04/06/2020   K 4.2 04/06/2020   CL 106 04/06/2020   CO2 28 04/06/2020   No results found for: ALT, AST, GGT, ALKPHOS, BILITOT   Microbiology: Recent Results (from the past 240 hour(s))  SARS CORONAVIRUS 2 (TAT 6-24 HRS) Nasopharyngeal Nasopharyngeal Swab     Status: None   Collection Time: 04/03/20 11:59 AM   Specimen: Nasopharyngeal Swab  Result Value Ref  Range Status   SARS Coronavirus 2 NEGATIVE NEGATIVE Final    Comment: (NOTE) SARS-CoV-2 target nucleic acids are NOT DETECTED.  The SARS-CoV-2 RNA is generally detectable in upper and lower respiratory specimens during the acute phase of infection. Negative results do not preclude SARS-CoV-2 infection, do not rule out co-infections with other pathogens, and should not be used as the sole basis for treatment or other patient management decisions. Negative results must be combined with clinical observations, patient history, and epidemiological information. The expected result is Negative.  Fact Sheet for Patients: SugarRoll.be  Fact Sheet for Healthcare Providers: https://www.woods-mathews.com/  This test is not yet approved or cleared by the [place] FDA and  has been authorized for detection and/or diagnosis of SARS-CoV-2 by FDA under an Emergency Use Authorization (EUA). This EUA will remain  in effect (meaning this test can be used) for the duration of the COVID-19 declaration under Se ction 564(b)(1) of the Act, 21 U.S.C. section 360bbb-3(b)(1), unless the authorization is terminated or revoked sooner.  Performed at [facility], [place] [address] [place], [place] [address]   Aerobic/Anaerobic Culture (surgical/deep wound)     Status: None (Preliminary result)   Collection Time: 04/04/20 11:41 AM   Specimen: Wound; Tissue  Result Value Ref Range Status   Specimen Description TISSUE  Final   Special Requests LEFT SYNOVIAL LINING SPEC A  Final   Gram Stain   Final    ABUNDANT WBC PRESENT,BOTH PMN AND MONONUCLEAR NO ORGANISMS SEEN    Culture   Final    NO GROWTH < 24 HOURS Performed at [facility], [place] [address], [place],  [address]    Report Status PENDING  Incomplete     Terri Piedra, NP [facility]  04/06/2020  11:04 AM

## 2020-04-06 NOTE — Progress Notes (Signed)
Occupational Therapy Treatment Patient Details Name: Eric Murray MRN: [id number] DOB: [birth year] Today's Date: 04/06/2020    History of present illness Pt is a 59 y.o. male admitted 04/04/20 with septic L knee, s/p L knee synovectomy and hemovac drain placement. PMH includes RA, HTN, L knee arthroscopic debridement (2020).   OT comments  Pt progressing toward established OT goals. He continues to demonstrate limited ROM in Lknee flexion and has increased pain with ROM, which limits his safety with sit<>stand transfers. Pt continues to require supervision for functional mobility due to reports of intermittent dizziness. Pt demonstrates ability to don/doff socks while sitting in recliner. Pt will continue to benefit from skilled OT services to maximize safety and independence with ADL/IADL and functional mobility. Will continue to follow acutely and progress as tolerated.    Follow Up Recommendations  No OT follow up;Supervision - Intermittent    Equipment Recommendations  Toilet riser    Recommendations for Other Services      Precautions / Restrictions Precautions Precautions: Fall Restrictions Weight Bearing Restrictions: Yes LLE Weight Bearing: Weight bearing as tolerated       Mobility Bed Mobility Overal bed mobility: Modified Independent             General bed mobility comments: pt progressed to EOB with use of gait belt to hook LLE and bring off EOB  Transfers Overall transfer level: Needs assistance Equipment used: Rolling walker (2 wheeled) Transfers: Sit to/from Stand Sit to Stand: Supervision         General transfer comment: Supervision for safety.  Adjusted RW height for optimal fit.    Balance Overall balance assessment: Needs assistance Sitting-balance support: Feet supported Sitting balance-Leahy Scale: Good Sitting balance - Comments: Able to reach bilateral feet   Standing balance support: No upper extremity supported Standing  balance-Leahy Scale: Fair Standing balance comment: Can static stand without UE support but minimal WB through LLE                           ADL either performed or assessed with clinical judgement   ADL Overall ADL's : Needs assistance/impaired     Grooming: Supervision/safety;Standing;Wash/dry hands;Wash/dry face               Lower Body Dressing: Sit to/from stand;Supervision/safety Lower Body Dressing Details (indicate cue type and reason): pt able to don/doff socks sitting in recliner             Functional mobility during ADLs: Supervision/safety;Rolling walker General ADL Comments: supervision for safety, pt reporting dizziness intermittently during mobility     Vision       Perception     Praxis      Cognition Arousal/Alertness: Awake/alert Behavior During Therapy: WFL for tasks assessed/performed Overall Cognitive Status: Within Functional Limits for tasks assessed                                          Exercises General Exercises - Lower Extremity Heel Slides: AAROM;Left;10 reps;Supine Other Exercises Other Exercises: educated pt on importance of progressing Lknee flexion during sit<>stand transfers throughout the day   Shoulder Instructions       General Comments      Pertinent Vitals/ Pain       Pain Assessment: Faces Faces Pain Scale: Hurts even more Pain Location: L knee Pain Descriptors / Indicators:  Discomfort;Guarding Pain Intervention(s): Monitored during session;Limited activity within patient's tolerance  Home Living                                          Prior Functioning/Environment              Frequency  Min 2X/week        Progress Toward Goals  OT Goals(current goals can now be found in the care plan section)  Progress towards OT goals: Progressing toward goals  Acute Rehab OT Goals Patient Stated Goal: be independent  OT Goal Formulation: With patient Time  For Goal Achievement: 04/19/20 Potential to Achieve Goals: Good ADL Goals Pt Will Perform Grooming: with modified independence;standing Pt Will Perform Upper Body Dressing: with modified independence;sitting Pt Will Perform Lower Body Dressing: with modified independence;sit to/from stand;sitting/lateral leans Pt Will Transfer to Toilet: with modified independence;ambulating (raised toilet seat) Pt Will Perform Toileting - Clothing Manipulation and hygiene: with modified independence;sit to/from stand  Plan Discharge plan remains appropriate    Co-evaluation                 AM-PAC OT "6 Clicks" Daily Activity     Outcome Measure   Help from another person eating meals?: None Help from another person taking care of personal grooming?: A Little Help from another person toileting, which includes using toliet, bedpan, or urinal?: A Little Help from another person bathing (including washing, rinsing, drying)?: A Little Help from another person to put on and taking off regular upper body clothing?: A Little Help from another person to put on and taking off regular lower body clothing?: A Little 6 Click Score: 19    End of Session Equipment Utilized During Treatment: Rolling walker  OT Visit Diagnosis: Other abnormalities of gait and mobility (R26.89);Pain Pain - Right/Left: Left Pain - part of body: Knee   Activity Tolerance Patient tolerated treatment well   Patient Left in chair;with call bell/phone within reach;with chair alarm set   Nurse Communication Mobility status        Time: 1351-1415 OT Time Calculation (min): 24 min  Charges: OT General Charges $OT Visit: 1 Visit OT Treatments $Self Care/Home Management : 23-37 mins  Helene Kelp OTR/L Acute Rehabilitation Services Office: [phone]    Wyn Forster 04/06/2020, 3:31 PM

## 2020-04-06 NOTE — Progress Notes (Signed)
Physical Therapy Treatment Patient Details Name: Eric Murray MRN: [id number] DOB: [birth year] Today's Date: 04/06/2020    History of Present Illness Pt is a 59 y.o. male admitted 04/04/20 with septic L knee, s/p L knee synovectomy and hemovac drain placement. PMH includes RA, HTN, L knee arthroscopic debridement (2020).   PT Comments    Pt progressing well with mobility. Mod indep transfers, ambulation, and stair training with RW. Demonstrates improved ability to WBAT through LLE as well as L knee ROM. Pt hopeful for d/c home tomorrow. Reviewed precautions, positioning, therex and importance of mobility. Pt has met short-term acute PT goals; has no further questions or concerns. Will d/c acute PT.   Follow Up Recommendations  Outpatient PT;Supervision - Intermittent     Equipment Recommendations  Rolling walker with 5" wheels (already delivered)    Recommendations for Other Services       Precautions / Restrictions Precautions Precautions: Fall Restrictions Weight Bearing Restrictions: Yes LLE Weight Bearing: Weight bearing as tolerated    Mobility  Bed Mobility Overal bed mobility: Modified Independent             General bed mobility comments: Seated in recliner  Transfers Overall transfer level: Modified independent Equipment used: Rolling walker (2 wheeled) Transfers: Sit to/from Stand Sit to Stand: Supervision         General transfer comment: Supervision for safety.  Adjusted RW height for optimal fit.  Ambulation/Gait Ambulation/Gait assistance: Modified independent (Device/Increase time) Gait Distance (Feet): 60 Feet Assistive device: Rolling walker (2 wheeled);IV Pole Gait Pattern/deviations: Step-to pattern;Decreased weight shift to left;Trunk flexed;Antalgic Gait velocity: Decreased   General Gait Details: Slow, antalgic gait mod indep with RW, improved L knee extension as well as increasing WBAT thru LLE; also able to demonstrate mod indep  gait with IV pole   Stairs Stairs: Yes Stairs assistance: Modified independent (Device/Increase time) Stair Management: Step to pattern;Backwards;Forwards;With walker Number of Stairs: 1 General stair comments: Ascend/descended 1 step with RW, mod indep after educ on technique   Wheelchair Mobility    Modified Rankin (Stroke Patients Only)       Balance Overall balance assessment: Needs assistance Sitting-balance support: Feet supported Sitting balance-Leahy Scale: Good Sitting balance - Comments: Able to reach bilateral feet   Standing balance support: No upper extremity supported Standing balance-Leahy Scale: Fair Standing balance comment: Can static stand without UE support but minimal WB through LLE                            Cognition Arousal/Alertness: Awake/alert Behavior During Therapy: WFL for tasks assessed/performed Overall Cognitive Status: Within Functional Limits for tasks assessed                                        Exercises General Exercises - Lower Extremity Heel Slides: AAROM;Left;10 reps;Supine Other Exercises Other Exercises: educated pt on importance of progressing Lknee flexion during sit<>stand transfers throughout the day Other Exercises: Standing knee extension and calf stretch    General Comments        Pertinent Vitals/Pain Pain Assessment: Faces Faces Pain Scale: Hurts even more Pain Location: L knee Pain Descriptors / Indicators: Discomfort;Guarding Pain Intervention(s): Premedicated before session;Monitored during session    Home Living  Prior Function            PT Goals (current goals can now be found in the care plan section) Acute Rehab PT Goals Patient Stated Goal: be independent  Progress towards PT goals: Goals met/education completed, patient discharged from PT    Frequency    Min 5X/week      PT Plan Current plan remains appropriate     Co-evaluation              AM-PAC PT "6 Clicks" Mobility   Outcome Measure  Help needed turning from your back to your side while in a flat bed without using bedrails?: None Help needed moving from lying on your back to sitting on the side of a flat bed without using bedrails?: None Help needed moving to and from a bed to a chair (including a wheelchair)?: None Help needed standing up from a chair using your arms (e.g., wheelchair or bedside chair)?: None Help needed to walk in hospital room?: None Help needed climbing 3-5 steps with a railing? : None 6 Click Score: 24    End of Session   Activity Tolerance: Patient tolerated treatment well Patient left: with call bell/phone within reach Nurse Communication: Mobility status PT Visit Diagnosis: Other abnormalities of gait and mobility (R26.89);Pain Pain - Right/Left: Left Pain - part of body: Knee     Time: 2162-4469 PT Time Calculation (min) (ACUTE ONLY): 18 min  Charges:  $Gait Training: 8-22 mins                     Mabeline Caras, PT, DPT Acute Rehabilitation Services  Pager [phone] Office [place] 04/06/2020, 4:10 PM

## 2020-04-06 NOTE — Telephone Encounter (Signed)
Patient called with employer fax number for doctor's note to be faxed to. The faxed number is [phone]. Please contact patient if any further questions. Patient phone number is [phone].

## 2020-04-06 NOTE — Plan of Care (Signed)
  Problem: Clinical Measurements: Goal: Will remain free from infection Outcome: Progressing Goal: Diagnostic test results will improve Outcome: Progressing Goal: Respiratory complications will improve Outcome: Progressing Goal: Cardiovascular complication will be avoided Outcome: Progressing   Problem: Activity: Goal: Risk for activity intolerance will decrease Outcome: Progressing   Problem: Nutrition: Goal: Adequate nutrition will be maintained Outcome: Progressing   Problem: Coping: Goal: Level of anxiety will decrease Outcome: Progressing   Problem: Elimination: Goal: Will not experience complications related to bowel motility Outcome: Progressing Goal: Will not experience complications related to urinary retention Outcome: Progressing   Problem: Pain Managment: Goal: General experience of comfort will improve Outcome: Progressing   Problem: Safety: Goal: Ability to remain free from injury will improve Outcome: Progressing   Problem: Skin Integrity: Goal: Risk for impaired skin integrity will decrease Outcome: Progressing   Problem: Clinical Measurements: Goal: Will remain free from infection Outcome: Progressing Goal: Diagnostic test results will improve Outcome: Progressing Goal: Respiratory complications will improve Outcome: Progressing Goal: Cardiovascular complication will be avoided Outcome: Progressing   Problem: Activity: Goal: Risk for activity intolerance will decrease Outcome: Progressing   Problem: Nutrition: Goal: Adequate nutrition will be maintained Outcome: Progressing   Problem: Coping: Goal: Level of anxiety will decrease Outcome: Progressing   Problem: Elimination: Goal: Will not experience complications related to bowel motility Outcome: Progressing Goal: Will not experience complications related to urinary retention Outcome: Progressing   Problem: Pain Managment: Goal: General experience of comfort will improve Outcome:  Progressing   Problem: Safety: Goal: Ability to remain free from injury will improve Outcome: Progressing   Problem: Skin Integrity: Goal: Risk for impaired skin integrity will decrease Outcome: Progressing   

## 2020-04-06 NOTE — Telephone Encounter (Signed)
Letter faxed to number provided.

## 2020-04-06 NOTE — Progress Notes (Signed)
D/C hemovac as ordered.No bleeding noted. Hemovac line intact..  Wound site dry with staples and no s/sx of infection noted.Per PA ok to use dry 4x4 and wrap wound to left knee with ace wrap. Pt has no complaints.

## 2020-04-06 NOTE — Plan of Care (Signed)
  Problem: Education: Goal: Knowledge of General Education information will improve Description: Including pain rating scale, medication(s)/side effects and non-pharmacologic comfort measures Outcome: Progressing   Problem: Clinical Measurements: Goal: Will remain free from infection Outcome: Progressing   Problem: Elimination: Goal: Will not experience complications related to bowel motility Outcome: Progressing   Problem: Pain Managment: Goal: General experience of comfort will improve Outcome: Progressing

## 2020-04-06 NOTE — Progress Notes (Signed)
PHARMACY CONSULT NOTE FOR:  OUTPATIENT  PARENTERAL ANTIBIOTIC THERAPY (OPAT)  Indication: Septic joint infection  Regimen: Vancomycin 1000 mg IV q12h, ceftriaxone 2g IV q24h  End date: 05/02/2020  IV antibiotic discharge orders are pended. To discharging provider:  please sign these orders via discharge navigator,  Select New Orders & click on the button choice - Manage This Unsigned Work.     Thank you for allowing pharmacy to be a part of this patient's care.  Phillis Haggis 04/06/2020, 10:14 AM

## 2020-04-06 NOTE — Progress Notes (Signed)
Postop day 2 status post irrigation and debridement synovium septic knee on the left.  Patient is lying in bed comfortably.  Denies fever chills or calf pain.   Vital signs stable afebrile.  Cultures today still pending with white cells no bacteria.  PICC line was placed today.  Will await recommendations by infectious disease with regarding to discharge antibiotics.  Plan for discharge in next 1 to 2 days.  Hemovac drain will be removed today as it is draining less than 30 cc per shift

## 2020-04-06 NOTE — TOC Initial Note (Signed)
Transition of Care [facility]) - Initial/Assessment Note    Patient Details  Name: Eric Murray MRN: [id number] Date of Birth: 12/08/1960  Transition [facility]) CM/SW Contact:    Curlene Labrum, RN Phone Number: 04/06/2020, 11:07 AM  Clinical Narrative:                 Case Management met with the patient regarding transitions to home and probable need for Iv antibiotics for next 4 weeks and home health RN and PT - relating to septic left knee.  Carolynn Sayers with [facility] will be following the patient for discharge to home for IV antibiotic coordination.  I called and spoke with [facility] and they will be providing the patient with home health PT and RN for IV antibiotics.  Will continue to follow for discharge.  Expected Discharge Plan: [place] Barriers to Discharge: Continued Medical Work up   Patient Goals and CMS Choice Patient states their goals for this hospitalization and ongoing recovery are:: Plan to discharge with IV antibiotics - Carolynn Sayers is following CMS Medicare.gov Compare Post Acute Care list provided to:: Patient Choice offered to / list presented to : Patient  Expected Discharge Plan and Services Expected Discharge Plan: [place]   Discharge Planning Services: CM Consult Post Acute Care Choice: [place] arrangements for the past 2 months: Single Family Home                 DME Arranged: Walker rolling DME Agency: [facility] Date DME Agency Contacted: 04/06/20 Time DME Agency Contacted: [phone] Representative spoke with at DME Agency: rolling walker will be provided by 5 N from closet supply prior to discharge. HH Arranged:  (Spoke with [facility] who is following the patient.) HH Agency: [facility] Date [place]: 04/06/20 Time [place]: [phone] Representative spoke with at [place]: Carolynn Sayers, RNCM is following  Prior Living  Arrangements/Services Living arrangements for the past 2 months: [place] with:: Spouse Patient language and need for interpreter reviewed:: Yes Do you feel safe going back to the place where you live?: Yes      Need for Family Participation in Patient Care: Yes (Comment) Care giver support system in place?: Yes (comment) Current home services: Home RN Criminal Activity/Legal Involvement Pertinent to Current Situation/Hospitalization: No - Comment as needed  Activities of Daily Living Home Assistive Devices/Equipment: Crutches ADL Screening (condition at time of admission) Patient's cognitive ability adequate to safely complete daily activities?: Yes Is the patient deaf or have difficulty hearing?: No Does the patient have difficulty seeing, even when wearing glasses/contacts?: No Does the patient have difficulty concentrating, remembering, or making decisions?: No Patient able to express need for assistance with ADLs?: Yes Does the patient have difficulty dressing or bathing?: No Independently performs ADLs?: Yes (appropriate for developmental age) Does the patient have difficulty walking or climbing stairs?: No Weakness of Legs: Left Weakness of Arms/Hands: None  Permission Sought/Granted Permission sought to share information with : Case Manager Permission granted to share information with : Yes, Verbal Permission Granted     Permission granted to share info w AGENCY: [place] - ; looking for Home health RN to pair with [facility] - [facility] - No, [facility] - left message  Permission granted to share info w Relationship: spouse     Emotional Assessment Appearance:: Appears stated age Attitude/Demeanor/Rapport: Gracious Affect (typically observed): Accepting Orientation: :  Oriented to Self, Oriented to Place, Oriented to  Time, Oriented to Situation   Psych Involvement: No (comment)  Admission diagnosis:  Septic infrapatellar bursitis of left knee [M07.680,  B96.89] Patient Active Problem List   Diagnosis Date Noted  . Septic infrapatellar bursitis of left knee 04/04/2020  . Pyogenic arthritis of left knee joint ([place])   . RECTAL BLEEDING 08/15/2008   PCP:  Leanna Battles, MD Pharmacy:   [facility] [place] [place] - [address] [place]) [place] [address] Phone: [phone] Fax: [place], [place] - [address] [place] [address] Phone: [phone] Fax: [phone]     Social Determinants of Health (SDOH) Interventions    Readmission Risk Interventions Readmission Risk Prevention Plan 04/06/2020  Post Dischage Appt Complete  Medication Screening Complete  Transportation Screening Complete  Some recent data might be hidden

## 2020-04-07 DIAGNOSIS — M71162 Other infective bursitis, left knee: Secondary | ICD-10-CM | POA: Diagnosis not present

## 2020-04-07 DIAGNOSIS — M009 Pyogenic arthritis, unspecified: Secondary | ICD-10-CM | POA: Diagnosis not present

## 2020-04-07 MED ORDER — OXYCODONE-ACETAMINOPHEN 5-325 MG PO TABS: 1.0000 | ORAL_TABLET | ORAL | 0 refills | Status: AC | PRN

## 2020-04-07 MED ORDER — CEFTRIAXONE IV (FOR PTA / DISCHARGE USE ONLY)
2.0000 g | INTRAVENOUS | 0 refills | Status: DC
Start: 2020-04-07 — End: 2020-04-29

## 2020-04-07 MED ORDER — VANCOMYCIN IV (FOR PTA / DISCHARGE USE ONLY)
1250.0000 mg | Freq: Two times a day (BID) | INTRAVENOUS | 0 refills | Status: DC
Start: 2020-04-07 — End: 2020-04-29

## 2020-04-07 NOTE — Progress Notes (Signed)
Pt to be discharged home with PICC line in place for home ABX. Significant other contacted to pick the patient up. AVS explained to the patient and the patient verbalizes understanding with no further questions or concerns. Vitals Stable.

## 2020-04-07 NOTE — Discharge Summary (Signed)
Discharge Diagnoses:  Active Problems:   Pyogenic arthritis of left knee joint (HCC)   Septic infrapatellar bursitis of left knee   Surgeries: Procedure(s): LEFT KNEE OPEN DEBRIDEMENT on 04/04/2020    Consultants:   Discharged Condition: Improved  Hospital Course: Eric Murray is an 59 y.o. male who was admitted 04/04/2020 with a chief complaint of septic arthritis left knee, with a final diagnosis of septic left knee.  Patient was brought to the operating room on 04/04/2020 and underwent Procedure(s): LEFT KNEE OPEN DEBRIDEMENT.    Patient was given perioperative antibiotics:  Anti-infectives (From admission, onward)   Start     Dose/Rate Route Frequency Ordered Stop   04/07/20 0900  vancomycin (VANCOREADY) IVPB 1250 mg/250 mL     Discontinue     1,250 mg 166.7 mL/hr over 90 Minutes Intravenous Every 12 hours 04/06/20 2308     04/07/20 0000  cefTRIAXone (ROCEPHIN) IVPB     Discontinue     2 g Intravenous Every 24 hours 04/07/20 0936 05/03/20 2359   04/07/20 0000  vancomycin IVPB     Discontinue     1,250 mg Intravenous Every 12 hours 04/07/20 0936 05/03/20 2359   04/05/20 0800  vancomycin (VANCOCIN) IVPB 1000 mg/200 mL premix  Status:  Discontinued        1,000 mg 200 mL/hr over 60 Minutes Intravenous Every 12 hours 04/04/20 1450 04/06/20 2308   04/04/20 2000  cefTRIAXone (ROCEPHIN) 2 g in sodium chloride 0.9 % 100 mL IVPB     Discontinue     2 g 200 mL/hr over 30 Minutes Intravenous Every 24 hours 04/04/20 1450     04/04/20 2000  vancomycin (VANCOREADY) IVPB 1500 mg/300 mL        1,500 mg 150 mL/hr over 120 Minutes Intravenous  Once 04/04/20 1450 04/05/20 0154   04/04/20 1015  ceFAZolin (ANCEF) IVPB 2g/100 mL premix        2 g 200 mL/hr over 30 Minutes Intravenous On call to O.R. 04/04/20 1002 04/04/20 1159    .  Patient was given sequential compression devices, early ambulation, and aspirin for DVT prophylaxis.  Recent vital signs:  Patient Vitals for the past 24  hrs:  BP Temp Temp src Pulse Resp SpO2  04/07/20 0431 (!) 131/88 98.5 F (36.9 C) Oral 97 16 99 %  04/06/20 2047 (!) 126/91 98.1 F (36.7 C) Oral 88 16 99 %  04/06/20 1509 114/82 97.7 F (36.5 C) Oral 74 17 98 %  .  Recent laboratory studies: [place] EKG SITE RITE  Result Date: 04/05/2020 If Site Rite image not attached, placement could not be confirmed due to current cardiac rhythm.   Discharge Medications:   Allergies as of 04/07/2020   No Known Allergies     Medication List    TAKE these medications   amLODipine 5 MG tablet Commonly known as: NORVASC Take 5 mg by mouth daily.   BC FAST PAIN RELIEF PO Take 1 packet by mouth daily as needed (pain).   cefTRIAXone  IVPB Commonly known as: ROCEPHIN Inject 2 g into the vein daily for 26 days. Indication:  Septic joint infection First Dose: no Last Day of Therapy:  05/02/2020 Labs - Once weekly:  CBC/D and BMP, Labs - Every other week:  ESR and CRP Method of administration: IV Push Method of administration may be changed at the discretion of home infusion pharmacist based upon assessment of the patient and/or caregiver's ability to self-administer the medication ordered.  Humira 40 MG/0.4ML Pskt Generic drug: Adalimumab Inject 40 mg into the skin every 14 (fourteen) days.   irbesartan 150 MG tablet Commonly known as: AVAPRO Take 150 mg by mouth daily.   methotrexate 2.5 MG tablet Commonly known as: RHEUMATREX Take 15 mg by mouth every Saturday. Caution:Chemotherapy. Protect from light.   oxyCODONE-acetaminophen 5-325 MG tablet Commonly known as: PERCOCET/ROXICET Take 1 tablet by mouth every 4 (four) hours as needed.   pravastatin 40 MG tablet Commonly known as: PRAVACHOL Take 40 mg by mouth daily.   vancomycin  IVPB Inject 1,250 mg into the vein every 12 (twelve) hours for 26 days. Indication:  Septic joint infection First Dose: no Last Day of Therapy:  05/02/2020 Labs - Sunday/Monday:  CBC/D, BMP, and vancomycin  trough. Labs - Thursday:  BMP and vancomycin trough Labs - Every other week:  ESR and CRP Method of administration:Elastomeric Method of administration may be changed at the discretion of the patient and/or caregiver's ability to self-administer the medication ordered.            Durable Medical Equipment  (From admission, onward)         Start     Ordered   04/06/20 1056  For home use only DME Walker rolling  Once       Question Answer Comment  Walker: With 5 Inch Wheels   Patient needs a walker to treat with the following condition Septic arthritis of knee, left (HCC)      04/06/20 1056           Discharge Care Instructions  (From admission, onward)         Start     Ordered   04/07/20 0000  Change dressing on IV access line weekly and PRN  (Home infusion instructions - [facility] )        04/07/20 0936   04/07/20 0000  Weight bearing as tolerated       Question Answer Comment  Laterality bilateral   Extremity Lower      07 /31/21 0936          Diagnostic Studies: [place] EKG SITE RITE  Result Date: 04/05/2020 If Site Rite image not attached, placement could not be confirmed due to current cardiac rhythm.   Patient benefited maximally from their hospital stay and there were no complications.     Disposition: Discharge disposition: 01-Home or Self Care      Discharge Instructions    [facility] pharmacist to adjust dose for Vancomycin, Aminoglycosides and other anti-infective therapies as requested by physician.   Complete by: As directed    [facility] to provide Cath Flo 51m   Complete by: As directed    Administer for PICC line occlusion and as ordered by physician for other access device issues.   Anaphylaxis Kit: Provided to treat any anaphylactic reaction to the medication being provided to the patient if First Dose or when requested by physician   Complete by: As directed    Epinephrine 170mml vial / amp:  Administer 0.14m68m0.14ml314mubcutaneously once for moderate to severe anaphylaxis, nurse to call physician and pharmacy when reaction occurs and call 911 if needed for immediate care   Diphenhydramine 50mg76mIV vial: Administer 25-50mg 18mM PRN for first dose reaction, rash, itching, mild reaction, nurse to call physician and pharmacy when reaction occurs   Sodium Chloride 0.9% NS 500ml I50mdminister if needed for hypovolemic blood pressure drop or as ordered by physician after  call to physician with anaphylactic reaction   Call MD / Call 911   Complete by: As directed    If you experience chest pain or shortness of breath, CALL 911 and be transported to the hospital emergency room.  If you develope a fever above 101 F, pus (white drainage) or increased drainage or redness at the wound, or calf pain, call your surgeon's office.   Change dressing on IV access line weekly and PRN   Complete by: As directed    Constipation Prevention   Complete by: As directed    Drink plenty of fluids.  Prune juice may be helpful.  You may use a stool softener, such as Colace (over the counter) 100 mg twice a day.  Use MiraLax (over the counter) for constipation as needed.   Diet - low sodium heart healthy   Complete by: As directed    Flush IV access with Sodium Chloride 0.9% and Heparin 10 units/ml or 100 units/ml   Complete by: As directed    Home infusion instructions - [facility]   Complete by: As directed    Instructions: Flush IV access with Sodium Chloride 0.9% and Heparin 10units/ml or 100units/ml   Change dressing on IV access line: Weekly and PRN   Instructions Cath Flo 43m: Administer for PICC Line occlusion and as ordered by physician for other access device   [facility] pharmacist to adjust dose for: Vancomycin, Aminoglycosides and other anti-infective therapies as requested by physician   Increase activity slowly as tolerated   Complete by: As directed    Method of  administration may be changed at the discretion of home infusion pharmacist based upon assessment of the patient and/or caregivers ability to self-administer the medication ordered   Complete by: As directed    Weight bearing as tolerated   Complete by: As directed    Laterality: bilateral   Extremity: Lower      Follow-up Information    Persons, MBevely Palmer PA In 1 week.   Specialty: Orthopedic Surgery Contact information: [phone]        [facility] Follow up.   Why: [facility] ([facility]) will be coordinating your IV antibiotics for home.       [facility] Follow up.   Why: [facility] will be providing your rolling walker for home prior to discharge. Contact information: [address] [place] [phone]       [facility]   Specialty: Home Health Services Why: [facility] be providing home health RN and PT for home therapy for you when you are discharged. Contact information: [id number]                Signed: MNewt Minion7/31/2021, 9:36 AM

## 2020-04-07 NOTE — TOC Progression Note (Signed)
Transition of Care [facility] - Progression Note    Patient Details  Name: Eric Murray MRN: [id number] Date of Birth: [birth year]  [facility]) CM/SW Contact  Claudie Leach, RN 04/07/2020, 10:23 AM  Clinical Narrative:    Patient to d/c home with [facility] and IV antibiotics.  Education regarding antibiotics administration done yesterday by Carolynn Sayers, RN with [facility].  Drugs ordered and ready to be delivered today.  [place] RN to start visits Monday.  Patient will self-administer until then.  RW ordered from [facility] for delivery to room prior to d/c.    Expected Discharge Plan: [place] Barriers to Discharge: Continued Medical Work up  Expected Discharge Plan and Services Expected Discharge Plan: [place]   Discharge Planning Services: CM Consult Post Acute Care Choice: [place] arrangements for the past 2 months: Single Family Home Expected Discharge Date: 04/07/20               DME Arranged: Gilford Rile rolling DME Agency: [facility] Date DME Agency Contacted: 04/06/20 Time DME Agency Contacted: [phone] Representative spoke with at DME Agency: rolling walker will be provided by 5 N from closet supply prior to discharge. HH Arranged:  (Spoke with [facility] who is following the patient.) HH Agency: [facility] Date HH Agency Contacted: 04/06/20 Time [place]: 2992 Representative spoke with at [place]: Carolynn Sayers, RNCM is following   Social Determinants of Health (SDOH) Interventions    Readmission Risk Interventions Readmission Risk Prevention Plan 04/06/2020  Post Dischage Appt Complete  Medication Screening Complete  Transportation Screening Complete  Some recent data might be hidden

## 2020-04-07 NOTE — Progress Notes (Signed)
Patient ID: Eric Murray, male   DOB: October 07, 1960, 59 y.o.   MRN: [id number] Patient is status post extensive synovectomy left knee.  Cultures are negative to date patient has no complaints at this time anticipate discharge on Monday.

## 2020-04-08 LAB — ANAEROBIC AND AEROBIC CULTURE
AER RESULT:: NO GROWTH
MICRO NUMBER:: 10748913
MICRO NUMBER:: 10748914
SPECIMEN QUALITY:: ADEQUATE
SPECIMEN QUALITY:: ADEQUATE

## 2020-04-08 LAB — SYNOVIAL CELL COUNT + DIFF, W/ CRYSTALS
Basophils, %: 0 %
Eosinophils-Synovial: 0 % (ref 0–2)
Lymphocytes-Synovial Fld: 6 % (ref 0–74)
Monocyte/Macrophage: 0 % (ref 0–69)
Neutrophil, Synovial: 94 % — ABNORMAL HIGH (ref 0–24)
Synoviocytes, %: 0 % (ref 0–15)
WBC, Synovial: 13410 cells/uL — ABNORMAL HIGH (ref <150)

## 2020-04-10 DIAGNOSIS — Z792 Long term (current) use of antibiotics: Secondary | ICD-10-CM | POA: Diagnosis not present

## 2020-04-10 DIAGNOSIS — Z452 Encounter for adjustment and management of vascular access device: Secondary | ICD-10-CM | POA: Diagnosis not present

## 2020-04-10 DIAGNOSIS — Z9181 History of falling: Secondary | ICD-10-CM | POA: Diagnosis not present

## 2020-04-10 DIAGNOSIS — M009 Pyogenic arthritis, unspecified: Secondary | ICD-10-CM | POA: Diagnosis not present

## 2020-04-10 DIAGNOSIS — M7042 Prepatellar bursitis, left knee: Secondary | ICD-10-CM | POA: Diagnosis not present

## 2020-04-10 DIAGNOSIS — M069 Rheumatoid arthritis, unspecified: Secondary | ICD-10-CM | POA: Diagnosis not present

## 2020-04-10 DIAGNOSIS — Z5181 Encounter for therapeutic drug level monitoring: Secondary | ICD-10-CM | POA: Diagnosis not present

## 2020-04-10 DIAGNOSIS — I119 Hypertensive heart disease without heart failure: Secondary | ICD-10-CM | POA: Diagnosis not present

## 2020-04-11 DIAGNOSIS — Z452 Encounter for adjustment and management of vascular access device: Secondary | ICD-10-CM | POA: Diagnosis not present

## 2020-04-11 DIAGNOSIS — M7042 Prepatellar bursitis, left knee: Secondary | ICD-10-CM | POA: Diagnosis not present

## 2020-04-11 DIAGNOSIS — Z9181 History of falling: Secondary | ICD-10-CM | POA: Diagnosis not present

## 2020-04-11 DIAGNOSIS — M009 Pyogenic arthritis, unspecified: Secondary | ICD-10-CM | POA: Diagnosis not present

## 2020-04-11 DIAGNOSIS — I119 Hypertensive heart disease without heart failure: Secondary | ICD-10-CM | POA: Diagnosis not present

## 2020-04-11 DIAGNOSIS — Z792 Long term (current) use of antibiotics: Secondary | ICD-10-CM | POA: Diagnosis not present

## 2020-04-11 DIAGNOSIS — M069 Rheumatoid arthritis, unspecified: Secondary | ICD-10-CM | POA: Diagnosis not present

## 2020-04-12 DIAGNOSIS — I119 Hypertensive heart disease without heart failure: Secondary | ICD-10-CM | POA: Diagnosis not present

## 2020-04-12 DIAGNOSIS — Z9181 History of falling: Secondary | ICD-10-CM | POA: Diagnosis not present

## 2020-04-12 DIAGNOSIS — M069 Rheumatoid arthritis, unspecified: Secondary | ICD-10-CM | POA: Diagnosis not present

## 2020-04-12 DIAGNOSIS — Z792 Long term (current) use of antibiotics: Secondary | ICD-10-CM | POA: Diagnosis not present

## 2020-04-12 DIAGNOSIS — M7042 Prepatellar bursitis, left knee: Secondary | ICD-10-CM | POA: Diagnosis not present

## 2020-04-12 DIAGNOSIS — M009 Pyogenic arthritis, unspecified: Secondary | ICD-10-CM | POA: Diagnosis not present

## 2020-04-12 DIAGNOSIS — Z452 Encounter for adjustment and management of vascular access device: Secondary | ICD-10-CM | POA: Diagnosis not present

## 2020-04-13 DIAGNOSIS — Z452 Encounter for adjustment and management of vascular access device: Secondary | ICD-10-CM | POA: Diagnosis not present

## 2020-04-13 DIAGNOSIS — I119 Hypertensive heart disease without heart failure: Secondary | ICD-10-CM | POA: Diagnosis not present

## 2020-04-13 DIAGNOSIS — M069 Rheumatoid arthritis, unspecified: Secondary | ICD-10-CM | POA: Diagnosis not present

## 2020-04-13 DIAGNOSIS — Z9181 History of falling: Secondary | ICD-10-CM | POA: Diagnosis not present

## 2020-04-13 DIAGNOSIS — Z792 Long term (current) use of antibiotics: Secondary | ICD-10-CM | POA: Diagnosis not present

## 2020-04-13 DIAGNOSIS — M009 Pyogenic arthritis, unspecified: Secondary | ICD-10-CM | POA: Diagnosis not present

## 2020-04-13 DIAGNOSIS — M7042 Prepatellar bursitis, left knee: Secondary | ICD-10-CM | POA: Diagnosis not present

## 2020-04-14 DIAGNOSIS — M71162 Other infective bursitis, left knee: Secondary | ICD-10-CM | POA: Diagnosis not present

## 2020-04-14 DIAGNOSIS — M009 Pyogenic arthritis, unspecified: Secondary | ICD-10-CM | POA: Diagnosis not present

## 2020-04-15 LAB — AEROBIC/ANAEROBIC CULTURE W GRAM STAIN (SURGICAL/DEEP WOUND): Culture: NO GROWTH

## 2020-04-16 ENCOUNTER — Telehealth: Payer: Self-pay | Admitting: Radiology

## 2020-04-16 ENCOUNTER — Encounter: Payer: Self-pay | Admitting: Physician Assistant

## 2020-04-16 ENCOUNTER — Ambulatory Visit (INDEPENDENT_AMBULATORY_CARE_PROVIDER_SITE_OTHER): Payer: BC Managed Care – PPO | Admitting: Physician Assistant

## 2020-04-16 VITALS — Ht 71.0 in | Wt 194.0 lb

## 2020-04-16 DIAGNOSIS — M7042 Prepatellar bursitis, left knee: Secondary | ICD-10-CM | POA: Diagnosis not present

## 2020-04-16 DIAGNOSIS — Z452 Encounter for adjustment and management of vascular access device: Secondary | ICD-10-CM | POA: Diagnosis not present

## 2020-04-16 DIAGNOSIS — M009 Pyogenic arthritis, unspecified: Secondary | ICD-10-CM | POA: Diagnosis not present

## 2020-04-16 DIAGNOSIS — I119 Hypertensive heart disease without heart failure: Secondary | ICD-10-CM | POA: Diagnosis not present

## 2020-04-16 DIAGNOSIS — G8929 Other chronic pain: Secondary | ICD-10-CM

## 2020-04-16 DIAGNOSIS — M25562 Pain in left knee: Secondary | ICD-10-CM

## 2020-04-16 DIAGNOSIS — M069 Rheumatoid arthritis, unspecified: Secondary | ICD-10-CM | POA: Diagnosis not present

## 2020-04-16 DIAGNOSIS — Z792 Long term (current) use of antibiotics: Secondary | ICD-10-CM | POA: Diagnosis not present

## 2020-04-16 DIAGNOSIS — Z9181 History of falling: Secondary | ICD-10-CM | POA: Diagnosis not present

## 2020-04-16 NOTE — Progress Notes (Signed)
Office Visit Note   Patient: Eric Murray           Date of Birth: 02-11-1961           MRN: [id number] Visit Date: 04/16/2020              Requested by: Leanna Battles, [place],  [place] [address] PCP: Leanna Battles, MD  Chief Complaint  Patient presents with  . Left Knee - Routine Post Op    04/04/20 left knee open debridement       HPI: A pleasant gentleman who is 11 days status post left knee open debridement.  He is currently receiving IV Rocephin and vancomycin.  He is doing well.  He did note that this morning when the nurse was by his blood pressure was a little low and he felt a touch lightheaded has also reported 2 episodes of a small amount of blood in his stool  Assessment & Plan: Visit Diagnoses: No diagnosis found.  Plan: We will follow up in 1 week for staple removal.  He is going to supplement with protein.  He is also going to make sure that he is taking in adequate fluids.  I have asked him to make an appointment with his primary care provider to address his lightheadedness as well as his 2 episodes of a small amount of blood in his stool.  He has agreed to do that and will do this this week  Follow-Up Instructions: No follow-ups on file.   Ortho Exam  Patient is alert, oriented, no adenopathy, well-dressed, normal affect, normal respiratory effort. Focused examination demonstrates well-healing surgical incision.  No effusion no cellulitis well apposed wound edges without any drainage or erythema.  Compartments soft and compressible range of motion in his knee is slightly decreased secondary to swelling  Imaging: No results found. No images are attached to the encounter.  Labs: Lab Results  Component Value Date   ESRSEDRATE 68 (H) 04/05/2020   CRP 4.1 (H) 04/05/2020   REPTSTATUS 04/15/2020 FINAL 04/04/2020   GRAMSTAIN  04/04/2020    ABUNDANT WBC PRESENT,BOTH PMN AND MONONUCLEAR NO ORGANISMS SEEN    CULT  04/04/2020    No  growth aerobically or anaerobically. Performed at [facility], [place] [address], [place], [place] [address]      No results found for: ALBUMIN, PREALBUMIN, LABURIC  No results found for: MG No results found for: VD25OH  No results found for: PREALBUMIN CBC EXTENDED Latest Ref Rng & Units 04/04/2020 04/06/2012 04/06/2012  WBC 4.0 - 10.5 K/uL 7.8 - 12.1(H)  RBC 4.22 - 5.81 MIL/uL 4.25 - 5.50  HGB 13.0 - 17.0 g/dL 11.8(L) 17.0 16.5  HCT 39 - 52 % 38.1(L) 50.0 47.9  PLT 150 - 400 K/uL 459(H) - 229  NEUTROABS 1.7 - 7.7 K/uL - - 8.7(H)  LYMPHSABS 0.7 - 4.0 K/uL - - 1.9     Body mass index is 27.06 kg/m.  Orders:  No orders of the defined types were placed in this encounter.  No orders of the defined types were placed in this encounter.    Procedures: No procedures performed  Clinical Data: No additional findings.  ROS:  All other systems negative, except as noted in the HPI. Review of Systems  Objective: Vital Signs: Ht 5\' 11"  (1.803 m)   Wt 194 lb (88 kg)   BMI 27.06 kg/m   Specialty Comments:  No specialty comments available.  PMFS History: Patient Active  Problem List   Diagnosis Date Noted  . Septic infrapatellar bursitis of left knee 04/04/2020  . Pyogenic arthritis of left knee joint ([place])   . RECTAL BLEEDING 08/15/2008   Past Medical History:  Diagnosis Date  . Arthritis   . Benign essential HTN   . Colon polyps    TA 2009  . History of kidney stones   . Hyperlipidemia   . Rheumatoid arthritis ([place])     Family History  Problem Relation Age of Onset  . Cataracts Mother 36       Pt died of old age  . Pancreatic cancer Father 77  . Prostate cancer Father   . Breast cancer Sister   . Cushing syndrome Brother   . Colon cancer Neg Hx   . Colon polyps Neg Hx     Past Surgical History:  Procedure Laterality Date  . arm fracture surgery     as child   . COLONOSCOPY    . HERNIA REPAIR    . I & D EXTREMITY Left 04/04/2020   Procedure: LEFT  KNEE OPEN DEBRIDEMENT;  Surgeon: Newt Minion, MD;  [facility];  Service: Orthopedics;  Laterality: Left;  . MENISCUS REPAIR Left   . POLYPECTOMY    . SYNOVECTOMY Left 04/04/2020   LEFT KNEE OPEN synovectomy both anterior posterior and medial and lateral gutters   Social History   Occupational History  . Occupation: ELECTRICIAN    Employer: [employer]  Tobacco Use  . Smoking status: Former Smoker    Packs/day: 1.00    Types: Cigarettes    Quit date: 04/04/2019    Years since quitting: 1.0  . Smokeless tobacco: Never Used  Vaping Use  . Vaping Use: Every day  Substance and Sexual Activity  . Alcohol use: Yes    Alcohol/week: 18.0 standard drinks    Types: 18 Cans of beer per week    Comment: mostly weekends  . Drug use: No  . Sexual activity: Not on file

## 2020-04-16 NOTE — Telephone Encounter (Signed)
Eric Murray with [name] needing verbal orders for HHPT 2x week x 4 weeks. Verbal given.

## 2020-04-17 DIAGNOSIS — Z792 Long term (current) use of antibiotics: Secondary | ICD-10-CM | POA: Diagnosis not present

## 2020-04-17 DIAGNOSIS — Z9181 History of falling: Secondary | ICD-10-CM | POA: Diagnosis not present

## 2020-04-17 DIAGNOSIS — Z452 Encounter for adjustment and management of vascular access device: Secondary | ICD-10-CM | POA: Diagnosis not present

## 2020-04-17 DIAGNOSIS — M009 Pyogenic arthritis, unspecified: Secondary | ICD-10-CM | POA: Diagnosis not present

## 2020-04-17 DIAGNOSIS — M069 Rheumatoid arthritis, unspecified: Secondary | ICD-10-CM | POA: Diagnosis not present

## 2020-04-17 DIAGNOSIS — M7042 Prepatellar bursitis, left knee: Secondary | ICD-10-CM | POA: Diagnosis not present

## 2020-04-17 DIAGNOSIS — I119 Hypertensive heart disease without heart failure: Secondary | ICD-10-CM | POA: Diagnosis not present

## 2020-04-19 DIAGNOSIS — M069 Rheumatoid arthritis, unspecified: Secondary | ICD-10-CM | POA: Diagnosis not present

## 2020-04-19 DIAGNOSIS — M009 Pyogenic arthritis, unspecified: Secondary | ICD-10-CM | POA: Diagnosis not present

## 2020-04-19 DIAGNOSIS — I119 Hypertensive heart disease without heart failure: Secondary | ICD-10-CM | POA: Diagnosis not present

## 2020-04-19 DIAGNOSIS — Z452 Encounter for adjustment and management of vascular access device: Secondary | ICD-10-CM | POA: Diagnosis not present

## 2020-04-19 DIAGNOSIS — Z9181 History of falling: Secondary | ICD-10-CM | POA: Diagnosis not present

## 2020-04-19 DIAGNOSIS — Z792 Long term (current) use of antibiotics: Secondary | ICD-10-CM | POA: Diagnosis not present

## 2020-04-19 DIAGNOSIS — M7042 Prepatellar bursitis, left knee: Secondary | ICD-10-CM | POA: Diagnosis not present

## 2020-04-20 ENCOUNTER — Telehealth: Payer: Self-pay

## 2020-04-20 DIAGNOSIS — M7042 Prepatellar bursitis, left knee: Secondary | ICD-10-CM | POA: Diagnosis not present

## 2020-04-20 DIAGNOSIS — I119 Hypertensive heart disease without heart failure: Secondary | ICD-10-CM | POA: Diagnosis not present

## 2020-04-20 DIAGNOSIS — M009 Pyogenic arthritis, unspecified: Secondary | ICD-10-CM | POA: Diagnosis not present

## 2020-04-20 DIAGNOSIS — Z452 Encounter for adjustment and management of vascular access device: Secondary | ICD-10-CM | POA: Diagnosis not present

## 2020-04-20 DIAGNOSIS — Z792 Long term (current) use of antibiotics: Secondary | ICD-10-CM | POA: Diagnosis not present

## 2020-04-20 DIAGNOSIS — M069 Rheumatoid arthritis, unspecified: Secondary | ICD-10-CM | POA: Diagnosis not present

## 2020-04-20 DIAGNOSIS — Z9181 History of falling: Secondary | ICD-10-CM | POA: Diagnosis not present

## 2020-04-20 NOTE — Telephone Encounter (Signed)
Eric Murray would like to know if it is ok to hold the vancomycin and the rocephin.   Are your referring to a one time dose for the rocephin?

## 2020-04-20 NOTE — Telephone Encounter (Signed)
Per PharmD  with [facility] [name]) vanc trough 27.6, Cr: 1.62. Patient currently taking vanc and rocephin for pyogenic arthritis of left knee, septic infrapatellar bursitis of left knee. [name] will advise the patient to hold vanc and continue rocephin if provider agrees. Eric Murray

## 2020-04-20 NOTE — Telephone Encounter (Signed)
Agree with holding vancomycin levels. Recheck renal function with next blood work. Can consider giving a 1 time dose of 500 ml if needed.

## 2020-04-20 NOTE — Telephone Encounter (Signed)
Return call to [place], Ramona with [facility] continue the rocephin and hold vancomycin.  If needed a 500 cc bolus of normal saline can be given.   Laverle Patter, RN

## 2020-04-21 DIAGNOSIS — M71162 Other infective bursitis, left knee: Secondary | ICD-10-CM | POA: Diagnosis not present

## 2020-04-21 DIAGNOSIS — M009 Pyogenic arthritis, unspecified: Secondary | ICD-10-CM | POA: Diagnosis not present

## 2020-04-23 ENCOUNTER — Encounter: Payer: Self-pay | Admitting: Physician Assistant

## 2020-04-23 ENCOUNTER — Telehealth: Payer: Self-pay

## 2020-04-23 ENCOUNTER — Ambulatory Visit (INDEPENDENT_AMBULATORY_CARE_PROVIDER_SITE_OTHER): Payer: BC Managed Care – PPO | Admitting: Physician Assistant

## 2020-04-23 VITALS — Ht 71.0 in | Wt 194.0 lb

## 2020-04-23 DIAGNOSIS — M25562 Pain in left knee: Secondary | ICD-10-CM

## 2020-04-23 DIAGNOSIS — Z792 Long term (current) use of antibiotics: Secondary | ICD-10-CM | POA: Diagnosis not present

## 2020-04-23 DIAGNOSIS — M069 Rheumatoid arthritis, unspecified: Secondary | ICD-10-CM | POA: Diagnosis not present

## 2020-04-23 DIAGNOSIS — Z452 Encounter for adjustment and management of vascular access device: Secondary | ICD-10-CM | POA: Diagnosis not present

## 2020-04-23 DIAGNOSIS — M009 Pyogenic arthritis, unspecified: Secondary | ICD-10-CM | POA: Diagnosis not present

## 2020-04-23 DIAGNOSIS — G8929 Other chronic pain: Secondary | ICD-10-CM

## 2020-04-23 DIAGNOSIS — I119 Hypertensive heart disease without heart failure: Secondary | ICD-10-CM | POA: Diagnosis not present

## 2020-04-23 DIAGNOSIS — Z9181 History of falling: Secondary | ICD-10-CM | POA: Diagnosis not present

## 2020-04-23 DIAGNOSIS — M7042 Prepatellar bursitis, left knee: Secondary | ICD-10-CM | POA: Diagnosis not present

## 2020-04-23 NOTE — Progress Notes (Signed)
Office Visit Note   Patient: Eric Murray           Date of Birth: 02-28-1961           MRN: [id number] Visit Date: 04/23/2020              Requested by: Leanna Battles, [place],  [place] [address] PCP: Leanna Battles, MD  Chief Complaint  Patient presents with  . Left Knee - Routine Post Op    04/04/20 left knee open debridement       HPI: This is a pleasant gentleman who is now 3 weeks status post irrigation and debridement of his left knee.  He is being followed by infectious disease.  He was on IV vancomycin and Rocephin.  His vancomycin has been recently discontinued secondary to an elevation in his kidney function.  He is due to follow-up with infectious disease this week.  Otherwise he is doing well and working with physical therapy  Assessment & Plan: Visit Diagnoses: No diagnosis found.  Plan: Patient will continue to work on range of motion of his knee.  Follow-up in 3 weeks.  Follow-Up Instructions: No follow-ups on file.   Ortho Exam  Patient is alert, oriented, no adenopathy, well-dressed, normal affect, normal respiratory effort. Focused examination of his left knee demonstrates well-healed surgical incision.  Minimal soft tissue swelling no effusion.  He does have some expected quadricep atrophy but is able to a straight leg raise with just a slight lag.  He comes to full extension and to 90 degrees of flexion no ascending cellulitis no fluctuance no evidence of infection  Imaging: No results found. No images are attached to the encounter.  Labs: Lab Results  Component Value Date   ESRSEDRATE 68 (H) 04/05/2020   CRP 4.1 (H) 04/05/2020   REPTSTATUS 04/15/2020 FINAL 04/04/2020   GRAMSTAIN  04/04/2020    ABUNDANT WBC PRESENT,BOTH PMN AND MONONUCLEAR NO ORGANISMS SEEN    CULT  04/04/2020    No growth aerobically or anaerobically. Performed at [facility], [place] [address] [place], [place] [address]      No results  found for: ALBUMIN, PREALBUMIN, LABURIC  No results found for: MG No results found for: VD25OH  No results found for: PREALBUMIN CBC EXTENDED Latest Ref Rng & Units 04/04/2020 04/06/2012 04/06/2012  WBC 4.0 - 10.5 K/uL 7.8 - 12.1(H)  RBC 4.22 - 5.81 MIL/uL 4.25 - 5.50  HGB 13.0 - 17.0 g/dL 11.8(L) 17.0 16.5  HCT 39 - 52 % 38.1(L) 50.0 47.9  PLT 150 - 400 K/uL 459(H) - 229  NEUTROABS 1.7 - 7.7 K/uL - - 8.7(H)  LYMPHSABS 0.7 - 4.0 K/uL - - 1.9     Body mass index is 27.06 kg/m.  Orders:  No orders of the defined types were placed in this encounter.  No orders of the defined types were placed in this encounter.    Procedures: No procedures performed  Clinical Data: No additional findings.  ROS:  All other systems negative, except as noted in the HPI. Review of Systems  Objective: Vital Signs: Ht 5\' 11"  (1.803 m)   Wt 194 lb (88 kg)   BMI 27.06 kg/m   Specialty Comments:  No specialty comments available.  PMFS History: Patient Active Problem List   Diagnosis Date Noted  . Septic infrapatellar bursitis of left knee 04/04/2020  . Pyogenic arthritis of left knee joint ([place])   . RECTAL BLEEDING 08/15/2008   Past  Medical History:  Diagnosis Date  . Arthritis   . Benign essential HTN   . Colon polyps    TA 2009  . History of kidney stones   . Hyperlipidemia   . Rheumatoid arthritis ([place])     Family History  Problem Relation Age of Onset  . Cataracts Mother 57       Pt died of old age  . Pancreatic cancer Father 6  . Prostate cancer Father   . Breast cancer Sister   . Cushing syndrome Brother   . Colon cancer Neg Hx   . Colon polyps Neg Hx     Past Surgical History:  Procedure Laterality Date  . arm fracture surgery     as child   . COLONOSCOPY    . HERNIA REPAIR    . I & D EXTREMITY Left 04/04/2020   Procedure: LEFT KNEE OPEN DEBRIDEMENT;  Surgeon: Newt Minion, MD;  Location: [place];  Service: Orthopedics;  Laterality: Left;  . MENISCUS REPAIR  Left   . POLYPECTOMY    . SYNOVECTOMY Left 04/04/2020   LEFT KNEE OPEN synovectomy both anterior posterior and medial and lateral gutters   Social History   Occupational History  . Occupation: ELECTRICIAN    Employer: [employer]  Tobacco Use  . Smoking status: Former Smoker    Packs/day: 1.00    Types: Cigarettes    Quit date: 04/04/2019    Years since quitting: 1.0  . Smokeless tobacco: Never Used  Vaping Use  . Vaping Use: Every day  Substance and Sexual Activity  . Alcohol use: Yes    Alcohol/week: 18.0 standard drinks    Types: 18 Cans of beer per week    Comment: mostly weekends  . Drug use: No  . Sexual activity: Not on file

## 2020-04-23 NOTE — Telephone Encounter (Signed)
Faxed work note to [phone] per pt request.

## 2020-04-24 DIAGNOSIS — M009 Pyogenic arthritis, unspecified: Secondary | ICD-10-CM | POA: Diagnosis not present

## 2020-04-24 DIAGNOSIS — M069 Rheumatoid arthritis, unspecified: Secondary | ICD-10-CM | POA: Diagnosis not present

## 2020-04-24 DIAGNOSIS — I119 Hypertensive heart disease without heart failure: Secondary | ICD-10-CM | POA: Diagnosis not present

## 2020-04-24 DIAGNOSIS — Z452 Encounter for adjustment and management of vascular access device: Secondary | ICD-10-CM | POA: Diagnosis not present

## 2020-04-24 DIAGNOSIS — Z9181 History of falling: Secondary | ICD-10-CM | POA: Diagnosis not present

## 2020-04-24 DIAGNOSIS — Z792 Long term (current) use of antibiotics: Secondary | ICD-10-CM | POA: Diagnosis not present

## 2020-04-24 DIAGNOSIS — M7042 Prepatellar bursitis, left knee: Secondary | ICD-10-CM | POA: Diagnosis not present

## 2020-04-24 NOTE — Telephone Encounter (Signed)
Received call from [facility] with [facility] regarding patient concerns. States for the past couple of days patient has experienced redness, itching from the waist up. Is holding off on vanc due to elevated trough and Cr. IS still taking Rocephin.  Patient was advised by home health to take benadryl to help with symptoms.  [facility] would like to know if patient should continue Rocephin or hold off for now. Also if provider had any advise for patient to handle symptoms.  Will update home health and patient with providers response. Robeline

## 2020-04-24 NOTE — Telephone Encounter (Signed)
Updated [facility] and patient with plan.

## 2020-04-24 NOTE — Telephone Encounter (Signed)
Ok to hold antibiotics until seen in the office on Friday. Will determine continued need for PICC at that time. Antibiotics should still cover over the next few days.

## 2020-04-25 DIAGNOSIS — M069 Rheumatoid arthritis, unspecified: Secondary | ICD-10-CM | POA: Diagnosis not present

## 2020-04-25 DIAGNOSIS — M71162 Other infective bursitis, left knee: Secondary | ICD-10-CM | POA: Diagnosis not present

## 2020-04-25 DIAGNOSIS — Z792 Long term (current) use of antibiotics: Secondary | ICD-10-CM | POA: Diagnosis not present

## 2020-04-25 DIAGNOSIS — M009 Pyogenic arthritis, unspecified: Secondary | ICD-10-CM | POA: Diagnosis not present

## 2020-04-25 DIAGNOSIS — N179 Acute kidney failure, unspecified: Secondary | ICD-10-CM | POA: Diagnosis not present

## 2020-04-25 DIAGNOSIS — Z452 Encounter for adjustment and management of vascular access device: Secondary | ICD-10-CM | POA: Diagnosis not present

## 2020-04-25 DIAGNOSIS — Z1152 Encounter for screening for COVID-19: Secondary | ICD-10-CM | POA: Diagnosis not present

## 2020-04-25 DIAGNOSIS — I959 Hypotension, unspecified: Secondary | ICD-10-CM | POA: Diagnosis not present

## 2020-04-25 DIAGNOSIS — Z79899 Other long term (current) drug therapy: Secondary | ICD-10-CM | POA: Diagnosis not present

## 2020-04-25 DIAGNOSIS — Z9181 History of falling: Secondary | ICD-10-CM | POA: Diagnosis not present

## 2020-04-25 DIAGNOSIS — M7042 Prepatellar bursitis, left knee: Secondary | ICD-10-CM | POA: Diagnosis not present

## 2020-04-25 DIAGNOSIS — I119 Hypertensive heart disease without heart failure: Secondary | ICD-10-CM | POA: Diagnosis not present

## 2020-04-26 ENCOUNTER — Encounter (HOSPITAL_COMMUNITY): Payer: Self-pay

## 2020-04-26 ENCOUNTER — Inpatient Hospital Stay (HOSPITAL_COMMUNITY): Payer: BC Managed Care – PPO

## 2020-04-26 ENCOUNTER — Inpatient Hospital Stay (HOSPITAL_COMMUNITY)
Admission: EM | Admit: 2020-04-26 | Discharge: 2020-04-29 | DRG: 683 | Disposition: A | Discharge status: Home Health | Payer: BC Managed Care – PPO | Attending: Internal Medicine | Admitting: Internal Medicine

## 2020-04-26 DIAGNOSIS — R42 Dizziness and giddiness: Secondary | ICD-10-CM | POA: Diagnosis not present

## 2020-04-26 DIAGNOSIS — Z79899 Other long term (current) drug therapy: Secondary | ICD-10-CM

## 2020-04-26 DIAGNOSIS — N179 Acute kidney failure, unspecified: Secondary | ICD-10-CM | POA: Diagnosis not present

## 2020-04-26 DIAGNOSIS — Z803 Family history of malignant neoplasm of breast: Secondary | ICD-10-CM | POA: Diagnosis not present

## 2020-04-26 DIAGNOSIS — E785 Hyperlipidemia, unspecified: Secondary | ICD-10-CM | POA: Diagnosis present

## 2020-04-26 DIAGNOSIS — Z87891 Personal history of nicotine dependence: Secondary | ICD-10-CM

## 2020-04-26 DIAGNOSIS — R21 Rash and other nonspecific skin eruption: Secondary | ICD-10-CM | POA: Diagnosis present

## 2020-04-26 DIAGNOSIS — Z79891 Long term (current) use of opiate analgesic: Secondary | ICD-10-CM | POA: Diagnosis not present

## 2020-04-26 DIAGNOSIS — B9689 Other specified bacterial agents as the cause of diseases classified elsewhere: Secondary | ICD-10-CM

## 2020-04-26 DIAGNOSIS — E871 Hypo-osmolality and hyponatremia: Secondary | ICD-10-CM | POA: Diagnosis not present

## 2020-04-26 DIAGNOSIS — R52 Pain, unspecified: Secondary | ICD-10-CM | POA: Diagnosis not present

## 2020-04-26 DIAGNOSIS — I10 Essential (primary) hypertension: Secondary | ICD-10-CM | POA: Diagnosis present

## 2020-04-26 DIAGNOSIS — R0902 Hypoxemia: Secondary | ICD-10-CM | POA: Diagnosis not present

## 2020-04-26 DIAGNOSIS — L270 Generalized skin eruption due to drugs and medicaments taken internally: Secondary | ICD-10-CM | POA: Diagnosis not present

## 2020-04-26 DIAGNOSIS — E860 Dehydration: Secondary | ICD-10-CM | POA: Diagnosis not present

## 2020-04-26 DIAGNOSIS — E872 Acidosis: Secondary | ICD-10-CM | POA: Diagnosis present

## 2020-04-26 DIAGNOSIS — D649 Anemia, unspecified: Secondary | ICD-10-CM | POA: Diagnosis not present

## 2020-04-26 DIAGNOSIS — T368X5A Adverse effect of other systemic antibiotics, initial encounter: Secondary | ICD-10-CM | POA: Diagnosis present

## 2020-04-26 DIAGNOSIS — L299 Pruritus, unspecified: Secondary | ICD-10-CM | POA: Diagnosis not present

## 2020-04-26 DIAGNOSIS — Z800 Family history of malignant neoplasm of digestive organs: Secondary | ICD-10-CM | POA: Diagnosis not present

## 2020-04-26 DIAGNOSIS — M71162 Other infective bursitis, left knee: Secondary | ICD-10-CM | POA: Diagnosis not present

## 2020-04-26 DIAGNOSIS — Z8042 Family history of malignant neoplasm of prostate: Secondary | ICD-10-CM

## 2020-04-26 DIAGNOSIS — M7052 Other bursitis of knee, left knee: Secondary | ICD-10-CM | POA: Diagnosis not present

## 2020-04-26 DIAGNOSIS — I959 Hypotension, unspecified: Secondary | ICD-10-CM | POA: Diagnosis not present

## 2020-04-26 DIAGNOSIS — M009 Pyogenic arthritis, unspecified: Secondary | ICD-10-CM | POA: Diagnosis present

## 2020-04-26 DIAGNOSIS — Z87442 Personal history of urinary calculi: Secondary | ICD-10-CM | POA: Diagnosis not present

## 2020-04-26 DIAGNOSIS — Z20822 Contact with and (suspected) exposure to covid-19: Secondary | ICD-10-CM | POA: Diagnosis not present

## 2020-04-26 DIAGNOSIS — M069 Rheumatoid arthritis, unspecified: Secondary | ICD-10-CM | POA: Diagnosis not present

## 2020-04-26 LAB — URINALYSIS, ROUTINE W REFLEX MICROSCOPIC
Bilirubin Urine: NEGATIVE
Glucose, UA: NEGATIVE mg/dL
Hgb urine dipstick: NEGATIVE
Ketones, ur: NEGATIVE mg/dL
Leukocytes,Ua: NEGATIVE
Nitrite: NEGATIVE
Protein, ur: NEGATIVE mg/dL
Specific Gravity, Urine: 1.006 (ref 1.005–1.030)
pH: 5 (ref 5.0–8.0)

## 2020-04-26 LAB — COMPREHENSIVE METABOLIC PANEL
ALT: 28 U/L (ref 0–44)
AST: 29 U/L (ref 15–41)
Albumin: 2.3 g/dL — ABNORMAL LOW (ref 3.5–5.0)
Alkaline Phosphatase: 51 U/L (ref 38–126)
Anion gap: 9 (ref 5–15)
BUN: 36 mg/dL — ABNORMAL HIGH (ref 6–20)
CO2: 19 mmol/L — ABNORMAL LOW (ref 22–32)
Calcium: 7.3 mg/dL — ABNORMAL LOW (ref 8.9–10.3)
Chloride: 100 mmol/L (ref 98–111)
Creatinine, Ser: 3.36 mg/dL — ABNORMAL HIGH (ref 0.61–1.24)
GFR calc Af Amer: 22 mL/min — ABNORMAL LOW (ref >60)
GFR calc non Af Amer: 19 mL/min — ABNORMAL LOW (ref >60)
Glucose, Bld: 103 mg/dL — ABNORMAL HIGH (ref 70–99)
Potassium: 4.5 mmol/L (ref 3.5–5.1)
Sodium: 128 mmol/L — ABNORMAL LOW (ref 135–145)
Total Bilirubin: 0.5 mg/dL (ref 0.3–1.2)
Total Protein: 4.9 g/dL — ABNORMAL LOW (ref 6.5–8.1)

## 2020-04-26 LAB — SEDIMENTATION RATE: Sed Rate: 25 mm/hr — ABNORMAL HIGH (ref 0–16)

## 2020-04-26 LAB — CBC WITH DIFFERENTIAL/PLATELET
Abs Immature Granulocytes: 0.02 10*3/uL (ref 0.00–0.07)
Basophils Absolute: 0 10*3/uL (ref 0.0–0.1)
Basophils Relative: 0 %
Eosinophils Absolute: 0.8 10*3/uL — ABNORMAL HIGH (ref 0.0–0.5)
Eosinophils Relative: 14 %
HCT: 31.1 % — ABNORMAL LOW (ref 39.0–52.0)
Hemoglobin: 9.8 g/dL — ABNORMAL LOW (ref 13.0–17.0)
Immature Granulocytes: 0 %
Lymphocytes Relative: 15 %
Lymphs Abs: 0.9 10*3/uL (ref 0.7–4.0)
MCH: 27 pg (ref 26.0–34.0)
MCHC: 31.5 g/dL (ref 30.0–36.0)
MCV: 85.7 fL (ref 80.0–100.0)
Monocytes Absolute: 0.5 10*3/uL (ref 0.1–1.0)
Monocytes Relative: 8 %
Neutro Abs: 3.7 10*3/uL (ref 1.7–7.7)
Neutrophils Relative %: 63 %
Platelets: 302 10*3/uL (ref 150–400)
RBC: 3.63 MIL/uL — ABNORMAL LOW (ref 4.22–5.81)
RDW: 14.2 % (ref 11.5–15.5)
WBC: 6 10*3/uL (ref 4.0–10.5)
nRBC: 0 % (ref 0.0–0.2)

## 2020-04-26 LAB — PROTIME-INR
INR: 1.4 — ABNORMAL HIGH (ref 0.8–1.2)
Prothrombin Time: 16.5 seconds — ABNORMAL HIGH (ref 11.4–15.2)

## 2020-04-26 LAB — TSH: TSH: 2.576 u[IU]/mL (ref 0.350–4.500)

## 2020-04-26 LAB — C-REACTIVE PROTEIN: CRP: 4.2 mg/dL — ABNORMAL HIGH (ref <1.0)

## 2020-04-26 LAB — APTT: aPTT: 32 seconds (ref 24–36)

## 2020-04-26 LAB — SARS CORONAVIRUS 2 BY RT PCR (HOSPITAL ORDER, PERFORMED IN ~~LOC~~ HOSPITAL LAB): SARS Coronavirus 2: NEGATIVE

## 2020-04-26 MED ORDER — ACETAMINOPHEN 325 MG PO TABS: 650.0000 mg | ORAL_TABLET | Freq: Four times a day (QID) | ORAL | Status: DC | PRN

## 2020-04-26 MED ORDER — SODIUM CHLORIDE 0.9 % IV SOLN: INTRAVENOUS | Status: DC

## 2020-04-26 MED ORDER — ONDANSETRON HCL 4 MG/2ML IJ SOLN: 4.0000 mg | Freq: Four times a day (QID) | INTRAMUSCULAR | Status: DC | PRN

## 2020-04-26 MED ORDER — HEPARIN SODIUM (PORCINE) 5000 UNIT/ML IJ SOLN
5000.0000 [IU] | Freq: Three times a day (TID) | INTRAMUSCULAR | Status: DC
  Administered 2020-04-26 – 2020-04-29 (×10): 5000 [IU] via SUBCUTANEOUS
  Filled 2020-04-26 (×10): qty 1

## 2020-04-26 MED ORDER — FAMOTIDINE IN NACL 20-0.9 MG/50ML-% IV SOLN
20.0000 mg | Freq: Once | INTRAVENOUS | Status: AC
  Administered 2020-04-26: 20 mg via INTRAVENOUS
  Filled 2020-04-26: qty 50

## 2020-04-26 MED ORDER — ONDANSETRON HCL 4 MG PO TABS: 4.0000 mg | ORAL_TABLET | Freq: Four times a day (QID) | ORAL | Status: DC | PRN

## 2020-04-26 MED ORDER — SODIUM CHLORIDE 0.9 % IV BOLUS
1000.0000 mL | Freq: Once | INTRAVENOUS | Status: AC
  Administered 2020-04-26: 1000 mL via INTRAVENOUS

## 2020-04-26 MED ORDER — METHYLPREDNISOLONE SODIUM SUCC 125 MG IJ SOLR
125.0000 mg | Freq: Once | INTRAMUSCULAR | Status: AC
  Administered 2020-04-26: 125 mg via INTRAVENOUS
  Filled 2020-04-26: qty 2

## 2020-04-26 MED ORDER — DIPHENHYDRAMINE HCL 50 MG/ML IJ SOLN
25.0000 mg | Freq: Once | INTRAMUSCULAR | Status: AC
  Administered 2020-04-26: 25 mg via INTRAVENOUS
  Filled 2020-04-26: qty 1

## 2020-04-26 MED ORDER — ACETAMINOPHEN 650 MG RE SUPP: 650.0000 mg | Freq: Four times a day (QID) | RECTAL | Status: DC | PRN

## 2020-04-26 NOTE — ED Provider Notes (Signed)
[facility] Emergency Department Provider Note MRN:  [id number]  Arrival date & time: 04/26/20     Chief Complaint   Allergic Reaction   History of Present Illness   Eric Murray is a 59 y.o. year-old male with a history of rheumatoid arthritis, hypertension presenting to the ED with chief complaint of allergic reaction.  Patient has been struggling with rash for over a week.  Recent surgery for septic bursitis of the left knee about 3 weeks ago.  Has been told that he developed a rash or allergy related to the vancomycin.  Has been off the vancomycin for the past 2 weeks.  Completed his ceftriaxone 1 week ago.  Still having rash that seems to be worsening over the past 2 days.  Swelling to the face around the eyes, diffuse itchiness.  Also noticing general malaise, lightheadedness, low blood pressure.  Denies headache or vision change, no chest pain or shortness of breath, no abdominal pain, no other new exposures, no other new medications.  No vomiting, no diarrhea.  Review of Systems  A complete 10 system review of systems was obtained and all systems are negative except as noted in the HPI and PMH.   Patient's Health History    Past Medical History:  Diagnosis Date  . Arthritis   . Benign essential HTN   . Colon polyps    TA 2009  . History of kidney stones   . Hyperlipidemia   . Rheumatoid arthritis [facility])     Past Surgical History:  Procedure Laterality Date  . arm fracture surgery     as child   . COLONOSCOPY    . HERNIA REPAIR    . I & D EXTREMITY Left 04/04/2020   Procedure: LEFT KNEE OPEN DEBRIDEMENT;  Surgeon: Newt Minion, MD;  Location: [facility];  Service: Orthopedics;  Laterality: Left;  . MENISCUS REPAIR Left   . POLYPECTOMY    . SYNOVECTOMY Left 04/04/2020   LEFT KNEE OPEN synovectomy both anterior posterior and medial and lateral gutters    Family History  Problem Relation Age of Onset  . Cataracts Mother 53       Pt died of  old age  . Pancreatic cancer Father 30  . Prostate cancer Father   . Breast cancer Sister   . Cushing syndrome Brother   . Colon cancer Neg Hx   . Colon polyps Neg Hx     Social History   Socioeconomic History  . Marital status: Legally Separated    Spouse name: Not on file  . Number of children: Not on file  . Years of education: Not on file  . Highest education level: Not on file  Occupational History  . Occupation: ELECTRICIAN    Employer: [employer]  Tobacco Use  . Smoking status: Former Smoker    Packs/day: 1.00    Types: Cigarettes    Quit date: 04/04/2019    Years since quitting: 1.0  . Smokeless tobacco: Never Used  Vaping Use  . Vaping Use: Every day  Substance and Sexual Activity  . Alcohol use: Yes    Alcohol/week: 18.0 standard drinks    Types: 18 Cans of beer per week    Comment: mostly weekends  . Drug use: No  . Sexual activity: Not on file  Other Topics Concern  . Not on file  Social History Narrative   Pt lives with girlfriend   Social Determinants of Health   Financial  Resource Strain:   . Difficulty of Paying Living Expenses: Not on file  Food Insecurity:   . Worried About Charity fundraiser in the Last Year: Not on file  . Ran Out of Food in the Last Year: Not on file  Transportation Needs:   . Lack of Transportation (Medical): Not on file  . Lack of Transportation (Non-Medical): Not on file  Physical Activity:   . Days of Exercise per Week: Not on file  . Minutes of Exercise per Session: Not on file  Stress:   . Feeling of Stress : Not on file  Social Connections:   . Frequency of Communication with Friends and Family: Not on file  . Frequency of Social Gatherings with Friends and Family: Not on file  . Attends Religious Services: Not on file  . Active Member of Clubs or Organizations: Not on file  . Attends Archivist Meetings: Not on file  . Marital Status: Not on file  Intimate Partner Violence:   . Fear of  Current or Ex-Partner: Not on file  . Emotionally Abused: Not on file  . Physically Abused: Not on file  . Sexually Abused: Not on file     Physical Exam   Vitals:   04/26/20 0800 04/26/20 0830  BP: (!) 86/58 92/69  Pulse: 74 71  Resp: 16 14  Temp:    SpO2: 97% 98%    CONSTITUTIONAL: Well-appearing, NAD NEURO:  Alert and oriented x 3, no focal deficits EYES:  eyes equal and reactive ENT/NECK:  no LAD, no JVD CARDIO: Regular rate, well-perfused, normal S1 and S2 PULM:  CTAB no wheezing or rhonchi GI/GU:  normal bowel sounds, non-distended, non-tender MSK/SPINE:  No gross deformities, no edema SKIN: Diffuse macular rash with some nonblanching portions on the lower extremities, moderate periorbital swelling bilaterally PSYCH:  Appropriate speech and behavior  *Additional and/or pertinent findings included in MDM below  Diagnostic and Interventional Summary    EKG Interpretation  Date/Time:  Thursday April 26 2020 07:36:25 EDT Ventricular Rate:  83 PR Interval:    QRS Duration: 95 QT Interval:  395 QTC Calculation: 465 R Axis:   41 Text Interpretation: Sinus rhythm Confirmed by Gerlene Fee [phone]) on 04/26/2020 7:39:16 AM      Labs Reviewed  COMPREHENSIVE METABOLIC PANEL - Abnormal; Notable for the following components:      Result Value   Sodium 128 (*)    CO2 19 (*)    Glucose, Bld 103 (*)    BUN 36 (*)    Creatinine, Ser 3.36 (*)    Calcium 7.3 (*)    Total Protein 4.9 (*)    Albumin 2.3 (*)    GFR calc non Af Amer 19 (*)    GFR calc Af Amer 22 (*)    All other components within normal limits  PROTIME-INR - Abnormal; Notable for the following components:   Prothrombin Time 16.5 (*)    INR 1.4 (*)    All other components within normal limits  CBC WITH DIFFERENTIAL/PLATELET - Abnormal; Notable for the following components:   RBC 3.63 (*)    Hemoglobin 9.8 (*)    HCT 31.1 (*)    Eosinophils Absolute 0.8 (*)    All other components within normal limits    CULTURE, BLOOD (SINGLE)  SARS CORONAVIRUS 2 BY RT PCR (HOSPITAL ORDER, [facility])  APTT  URINALYSIS, ROUTINE W REFLEX MICROSCOPIC  TSH    US RENAL  Final  Result      Medications  0.9 %  sodium chloride infusion ( Intravenous New Bag/Given 04/26/20 1031)  heparin injection 5,000 Units (5,000 Units Subcutaneous Not Given 04/26/20 1408)  sodium chloride 0.9 % bolus 1,000 mL (0 mLs Intravenous Stopped 04/26/20 0847)  sodium chloride 0.9 % bolus 1,000 mL (0 mLs Intravenous Stopped 04/26/20 0847)  methylPREDNISolone sodium succinate (SOLU-MEDROL) 125 mg/2 mL injection 125 mg (125 mg Intravenous Given 04/26/20 0729)  diphenhydrAMINE (BENADRYL) injection 25 mg (25 mg Intravenous Given 04/26/20 0729)  famotidine (PEPCID) IVPB 20 mg premix (0 mg Intravenous Stopped 04/26/20 0349)     Procedures  /  Critical Care Procedures  ED Course and Medical Decision Making  I have reviewed the triage vital signs, the nursing notes, and pertinent available records from the EMR.  Listed above are laboratory and imaging tests that I personally ordered, reviewed, and interpreted and then considered in my medical decision making (see below for details).  Timing seems inconsistent with anaphylaxis, considering delayed drug reaction such as dress, also considering ITP given the nonblanching features.  Providing fluids, antihistamines, steroids, will reassess.     Work-up reveals AKI, admitted to medicine for further care.  Barth Kirks. Sedonia Small, MD [place] [email or web address]  Final Clinical Impressions(s) / ED Diagnoses     ICD-10-CM   1. Acute renal failure (ARF) ([place])  N17.9 US RENAL    US RENAL    ED Discharge Orders    None       Discharge Instructions Discussed with and Provided to Patient:   Discharge Instructions   None       Maudie Flakes, MD 04/26/20 [phone]

## 2020-04-26 NOTE — ED Notes (Signed)
ED TO INPATIENT HANDOFF REPORT  ED Nurse Name and Phone #:  [phone]  S Name/Age/Gender Eric Murray 59 y.o. male Room/Bed: APA11/APA11  Code Status   Code Status: Prior  Home/SNF/Other Home Patient oriented to: self, place, time and situation Is this baseline? Yes   Triage Complete: Triage complete  Chief Complaint Acute renal failure (ARF) ([place]) [N17.9]  Triage Note Pt had a left knee open debridement with IV Rocephin and Vancomycin a few weeks ago. Diagnosed with infection in the knee. Is now having a rash because of the vanc, with erythema and eyes swelling. Given 1g of acetaminophen in route and 50 of Benadryl. Wife had COVID 10 days ago. He had a negative COVID test yesterday. Positive for orthostatic vital signs.     Allergies Allergies  Allergen Reactions  . Vancomycin     Red Man Syndrome     Level of Care/Admitting Diagnosis ED Disposition    ED Disposition Condition [place] [facility] Area: [facility] [[id number]]  Level of Care: Med-Surg [16]  Covid Evaluation: Asymptomatic Screening Protocol (No Symptoms)  Diagnosis: Acute renal failure (ARF) ([place]) [856314]  Admitting Physician: Barton Dubois [[id number]]  Attending Physician: Barton Dubois [[id number]]  Estimated length of stay: past midnight tomorrow  Certification:: I certify this patient will need inpatient services for at least 2 midnights       B Medical/Surgery History Past Medical History:  Diagnosis Date  . Arthritis   . Benign essential HTN   . Colon polyps    TA 2009  . History of kidney stones   . Hyperlipidemia   . Rheumatoid arthritis [facility])    Past Surgical History:  Procedure Laterality Date  . arm fracture surgery     as child   . COLONOSCOPY    . HERNIA REPAIR    . I & D EXTREMITY Left 04/04/2020   Procedure: LEFT KNEE OPEN DEBRIDEMENT;  Surgeon: Newt Minion, MD;  Location: [place];  Service: Orthopedics;  Laterality: Left;  . MENISCUS REPAIR Left   .  POLYPECTOMY    . SYNOVECTOMY Left 04/04/2020   LEFT KNEE OPEN synovectomy both anterior posterior and medial and lateral gutters     A IV Location/Drains/Wounds Patient Lines/Drains/Airways Status    Active Line/Drains/Airways    Name Placement date Placement time Site Days   Peripheral IV 04/26/20 Left Antecubital 04/26/20  0729  Antecubital  less than 1   PICC Single Lumen 04/05/20 PICC Right Brachial 38 cm 0 cm 04/05/20  1744  Brachial  21   Incision (Closed) 04/04/20 Knee Left 04/04/20  1200   22          Intake/Output Last 24 hours No intake or output data in the 24 hours ending 04/26/20 1520  Labs/Imaging Results for orders placed or performed during the hospital encounter of 04/26/20 (from the past 48 hour(s))  Comprehensive metabolic panel     Status: Abnormal   Collection Time: 04/26/20  7:51 AM  Result Value Ref Range   Sodium 128 (L) 135 - 145 mmol/L   Potassium 4.5 3.5 - 5.1 mmol/L   Chloride 100 98 - 111 mmol/L   CO2 19 (L) 22 - 32 mmol/L   Glucose, Bld 103 (H) 70 - 99 mg/dL    Comment: Glucose reference range applies only to samples taken after fasting for at least 8 hours.   BUN 36 (H) 6 - 20 mg/dL   Creatinine, Ser 3.36 (H) 0.61 - 1.24  mg/dL   Calcium 7.3 (L) 8.9 - 10.3 mg/dL   Total Protein 4.9 (L) 6.5 - 8.1 g/dL   Albumin 2.3 (L) 3.5 - 5.0 g/dL   AST 29 15 - 41 U/L   ALT 28 0 - 44 U/L   Alkaline Phosphatase 51 38 - 126 U/L   Total Bilirubin 0.5 0.3 - 1.2 mg/dL   GFR calc non Af Amer 19 (L) >60 mL/min   GFR calc Af Amer 22 (L) >60 mL/min   Anion gap 9 5 - 15    Comment: Performed at [facility], [address], [place], [place] [address]  Culture, blood (single) w Reflex to ID Panel     Status: None (Preliminary result)   Collection Time: 04/26/20  7:51 AM   Specimen: BLOOD  Result Value Ref Range   Specimen Description BLOOD RIGHT HAND    Special Requests      BOTTLES DRAWN AEROBIC AND ANAEROBIC Blood Culture adequate volume Performed at [facility], [address], [place], [place] [address]    Culture PENDING    Report Status PENDING   Protime-INR     Status: Abnormal   Collection Time: 04/26/20  7:51 AM  Result Value Ref Range   Prothrombin Time 16.5 (H) 11.4 - 15.2 seconds   INR 1.4 (H) 0.8 - 1.2    Comment: (NOTE) INR goal varies based on device and disease states. Performed at [facility], [address], [place], [place] [address]   APTT     Status: None   Collection Time: 04/26/20  7:51 AM  Result Value Ref Range   aPTT 32 24 - 36 seconds    Comment: Performed at [facility], [address] [place], [place] [address]  CBC with Differential     Status: Abnormal   Collection Time: 04/26/20  7:51 AM  Result Value Ref Range   WBC 6.0 4.0 - 10.5 K/uL   RBC 3.63 (L) 4.22 - 5.81 MIL/uL   Hemoglobin 9.8 (L) 13.0 - 17.0 g/dL   HCT 31.1 (L) 39 - 52 %   MCV 85.7 80.0 - 100.0 fL   MCH 27.0 26.0 - 34.0 pg   MCHC 31.5 30.0 - 36.0 g/dL   RDW 14.2 11.5 - 15.5 %   Platelets 302 150 - 400 K/uL   nRBC 0.0 0.0 - 0.2 %   Neutrophils Relative % 63 %   Neutro Abs 3.7 1.7 - 7.7 K/uL   Lymphocytes Relative 15 %   Lymphs Abs 0.9 0.7 - 4.0 K/uL   Monocytes Relative 8 %   Monocytes Absolute 0.5 0 - 1 K/uL   Eosinophils Relative 14 %   Eosinophils Absolute 0.8 (H) 0 - 0 K/uL   Basophils Relative 0 %   Basophils Absolute 0.0 0 - 0 K/uL   WBC Morphology TOXIC GRANULATION    Immature Granulocytes 0 %   Abs Immature Granulocytes 0.02 0.00 - 0.07 K/uL    Comment: Performed at [facility], [address] [place], [place] [address]  TSH     Status: None   Collection Time: 04/26/20  7:51 AM  Result Value Ref Range   TSH 2.576 0.350 - 4.500 uIU/mL    Comment: Performed by a 3rd Generation assay with a functional sensitivity of <=0.01 uIU/mL. Performed at [facility], [address], [place], [place] [address]   Urinalysis, Routine w reflex microscopic Urine, Clean Catch     Status: None   Collection Time: 04/26/20  8:50 AM   Result Value Ref Range   Color, Urine YELLOW YELLOW   APPearance CLEAR CLEAR   Specific Gravity, Urine 1.006 1.005 - 1.030   pH 5.0 5.0 - 8.0   Glucose, UA NEGATIVE NEGATIVE mg/dL   Hgb urine dipstick NEGATIVE NEGATIVE   Bilirubin Urine NEGATIVE NEGATIVE   Ketones, ur NEGATIVE NEGATIVE mg/dL   Protein, ur NEGATIVE NEGATIVE mg/dL   Nitrite NEGATIVE NEGATIVE   Leukocytes,Ua NEGATIVE NEGATIVE    Comment: Performed at [facility], [address] [place], [place] [address]  SARS Coronavirus 2 by RT PCR (hospital order, performed in [facility] lab) Nasopharyngeal Nasopharyngeal Swab     Status: None   Collection Time: 04/26/20  8:57 AM   Specimen: Nasopharyngeal Swab  Result Value Ref Range   SARS Coronavirus 2 NEGATIVE NEGATIVE    Comment: (NOTE) SARS-CoV-2 target nucleic acids are NOT DETECTED.  The SARS-CoV-2 RNA is generally detectable in upper and lower respiratory specimens during the acute phase of infection. The lowest concentration of SARS-CoV-2 viral copies this assay can detect is 250 copies / mL. A negative result does not preclude SARS-CoV-2 infection and should not be used as the sole basis for treatment or other patient management decisions.  A negative result may occur with improper specimen collection / handling, submission of specimen other than nasopharyngeal swab, presence of viral mutation(s) within the areas targeted by this assay, and inadequate number of viral copies (<250 copies / mL). A negative result must be combined with clinical observations, patient history, and epidemiological information.  Fact Sheet for Patients:   StrictlyIdeas.no  Fact Sheet for Healthcare Providers: BankingDealers.co.za  This test is not yet approved or  cleared by the [place] FDA and has been authorized for detection and/or diagnosis of SARS-CoV-2 by FDA under an Emergency Use Authorization (EUA).  This EUA will  remain in effect (meaning this test can be used) for the duration of the COVID-19 declaration under Section 564(b)(1) of the Act, 21 U.S.C. section 360bbb-3(b)(1), unless the authorization is terminated or revoked sooner.  Performed at [facility], [address], [place], [place] [address]    US RENAL  Result Date: 04/26/2020 CLINICAL DATA:  Acute renal failure. EXAM: RENAL / URINARY TRACT ULTRASOUND COMPLETE COMPARISON:  CT 04/06/2012 FINDINGS: Right Kidney: Renal measurements: 11.1 x 6.4 x 5.7 cm = volume: 214 mL. Echogenicity slightly increased. No mass or hydronephrosis visualized. Left Kidney: Renal measurements: 12.2 x 6.4 x 6.2 cm = volume: 257 mL. Echogenicity slightly increased. No mass or hydronephrosis visualized. Bladder: Appears normal for degree of bladder distention. Other: None. IMPRESSION: Slightly enlarged, slightly echogenic kidneys suggesting acute nephritis. No obstruction. Electronically Signed   By: Nelson Chimes M.D.   On: 04/26/2020 12:03    Pending Labs Unresulted Labs (From admission, onward)         None      Vitals/Pain Today's Vitals   04/26/20 0703 04/26/20 0730 04/26/20 0800 04/26/20 0830  BP: 99/68 (!) 88/64 (!) 86/58 92/69  Pulse: 93 86 74 71  Resp: 13 16 16 14   Temp: 99.3 F (37.4 C)     TempSrc: Oral     SpO2: 100% 96% 97% 98%  Weight:      Height:      PainSc:        Isolation Precautions No active isolations  Medications Medications  0.9 %  sodium chloride infusion ( Intravenous New Bag/Given 04/26/20 1031)  heparin injection 5,000 Units (5,000 Units  Subcutaneous Not Given 04/26/20 1408)  sodium chloride 0.9 % bolus 1,000 mL (0 mLs Intravenous Stopped 04/26/20 0847)  sodium chloride 0.9 % bolus 1,000 mL (0 mLs Intravenous Stopped 04/26/20 0847)  methylPREDNISolone sodium succinate (SOLU-MEDROL) 125 mg/2 mL injection 125 mg (125 mg Intravenous Given 04/26/20 0729)  diphenhydrAMINE (BENADRYL) injection 25 mg (25 mg Intravenous Given  04/26/20 0729)  famotidine (PEPCID) IVPB 20 mg premix (0 mg Intravenous Stopped 04/26/20 7591)    Mobility walks     Focused Assessments    R Recommendations: See Admitting Provider Note  Report given to:   Additional Notes:

## 2020-04-26 NOTE — ED Triage Notes (Signed)
Pt had a left knee open debridement with IV Rocephin and Vancomycin a few weeks ago. Diagnosed with infection in the knee. Is now having a rash because of the vanc, with erythema and eyes swelling. Given 1g of acetaminophen in route and 50 of Benadryl. Wife had COVID 10 days ago. He had a negative COVID test yesterday. Positive for orthostatic vital signs.

## 2020-04-26 NOTE — ED Notes (Signed)
Report to Tiffany, RN 

## 2020-04-26 NOTE — H&P (Signed)
History and Physical    Eric Murray ACZ:[id number] DOB: 04-Feb-1961 DOA: 04/26/2020  PCP: Leanna Battles, MD   Patient coming from: Home  I have personally briefly reviewed patient's old medical records in [place]  Chief Complaint: Rash, swelling around eyes feeling tired.  HPI: Eric Murray is a 59 y.o. male with medical history significant of hypertension, hyperlipidemia, rheumatoid arthritis and recent left knee septic arthritis/bursitis.  Who presented to the hospital secondary to disseminated rash, swelling around his eyes and feeling fatigue.  Patient denies any fever, chills, nausea, vomiting, chest pain, shortness of breath, dysuria, hematuria, melena, hematochezia or any other complaints.  Reports having antibiotics stopped recently secondary to elevated vancomycin trough, development of "red man" syndrome and swelling around his eyes with concern for allergic reaction.  Patient expressed taking the rest of his medication without missing any dose and had decreased appetite and oral intake for the last 3 to 4 days.  No sick contacts.  ED Course: Work-up demonstrating acute renal failure, hypertension and no signs of acute infection.  Given concern for allergy reaction steroids, Benadryl and Pepcid were provided on arrival.  Cultures a urinalysis taken.  TRH consulted on the patient for further evaluation and management.  Review of Systems: As per HPI otherwise all other systems reviewed and are negative.   Past Medical History:  Diagnosis Date  . Arthritis   . Benign essential HTN   . Colon polyps    TA 2009  . History of kidney stones   . Hyperlipidemia   . Rheumatoid arthritis [facility])     Past Surgical History:  Procedure Laterality Date  . arm fracture surgery     as child   . COLONOSCOPY    . HERNIA REPAIR    . I & D EXTREMITY Left 04/04/2020   Procedure: LEFT KNEE OPEN DEBRIDEMENT;  Surgeon: Newt Minion, MD;  Location: [facility];  Service:  Orthopedics;  Laterality: Left;  . MENISCUS REPAIR Left   . POLYPECTOMY    . SYNOVECTOMY Left 04/04/2020   LEFT KNEE OPEN synovectomy both anterior posterior and medial and lateral gutters    Social History  reports that he quit smoking about 12 months ago. His smoking use included cigarettes. He smoked 1.00 pack per day. He has never used smokeless tobacco. He reports current alcohol use of about 18.0 standard drinks of alcohol per week. He reports that he does not use drugs.  Allergies  Allergen Reactions  . Vancomycin     Red Man Syndrome     Family History  Problem Relation Age of Onset  . Cataracts Mother 73       Pt died of old age  . Pancreatic cancer Father 73  . Prostate cancer Father   . Breast cancer Sister   . Cushing syndrome Brother   . Colon cancer Neg Hx   . Colon polyps Neg Hx     Prior to Admission medications   Medication Sig Start Date End Date Taking? Authorizing Provider  Adalimumab (HUMIRA) 40 MG/0.4ML PSKT Inject 40 mg into the skin every 14 (fourteen) days.   Yes [provider]  amLODipine (NORVASC) 5 MG tablet Take 5 mg by mouth daily.  06/03/16  Yes [provider]  Aspirin-Caffeine (BC FAST PAIN RELIEF PO) Take 1 packet by mouth daily as needed (pain).   Yes [provider]  irbesartan (AVAPRO) 150 MG tablet Take 150 mg by mouth daily.  09/05/17  Yes [provider]  oxyCODONE-acetaminophen (PERCOCET/ROXICET) 5-325 MG tablet Take 1 tablet by mouth every 4 (four) hours as needed. 04/07/20  Yes Newt Minion, MD  pravastatin (PRAVACHOL) 40 MG tablet Take 40 mg by mouth daily.  06/03/16  Yes [provider]  cefTRIAXone (ROCEPHIN) IVPB Inject 2 g into the vein daily for 26 days. Indication:  Septic joint infection First Dose: no Last Day of Therapy:  05/02/2020 Labs - Once weekly:  CBC/D and BMP, Labs - Every other week:  ESR and CRP Method of administration: IV Push Method of administration may be changed  at the discretion of home infusion pharmacist based upon assessment of the patient and/or caregiver's ability to self-administer the medication ordered. Patient not taking: Reported on 04/26/2020 04/07/20 05/03/20  Newt Minion, MD  methotrexate (RHEUMATREX) 2.5 MG tablet Take 15 mg by mouth every Saturday. Caution:Chemotherapy. Protect from light. Patient not taking: Reported on 04/26/2020    [provider]  vancomycin IVPB Inject 1,250 mg into the vein every 12 (twelve) hours for 26 days. Indication:  Septic joint infection First Dose: no Last Day of Therapy:  05/02/2020 Labs - Sunday/Monday:  CBC/D, BMP, and vancomycin trough. Labs - Thursday:  BMP and vancomycin trough Labs - Every other week:  ESR and CRP Method of administration:Elastomeric Method of administration may be changed at the discretion of the patient and/or caregiver's ability to self-administer the medication ordered. Patient not taking: Reported on 04/26/2020 04/07/20 05/03/20  Newt Minion, MD    Physical Exam: Vitals:   04/26/20 1500 04/26/20 1530 04/26/20 1600 04/26/20 1653  BP: 102/65 99/61 (!) 112/98 95/61  Pulse:   72 71  Resp: _0 Temp:    97.8 F (36.6 C)  TempSrc:    Oral  SpO2:   97% 99%  Weight:    82.5 kg  Height:    _1  (1.803 m)    Constitutional: NAD, calm, comfortable Vitals:   04/26/20 1500 04/26/20 1530 04/26/20 1600 04/26/20 1653  BP: 102/65 99/61 (!) 112/98 95/61  Pulse:   72 71  Resp: _2 Temp:    97.8 F (36.6 C)  TempSrc:    Oral  SpO2:   97% 99%  Weight:    82.5 kg  Height:    _3  (1.803 m)   Eyes: PERRL, lids and conjunctivae normal, positive blepharitis.  No nystagmus. ENMT: Mucous membranes are moist. Posterior pharynx clear of any exudate or lesions. Neck: normal, supple, no masses, no thyromegaly, no JVD. Respiratory: clear to auscultation bilaterally, no wheezing, no crackles. Normal respiratory effort. No accessory muscle use.   Cardiovascular: Regular rate and rhythm, no murmurs / rubs / gallops. 2+ pedal pulses. No carotid bruits.  Abdomen: no tenderness, no masses palpated. No hepatosplenomegaly. Bowel sounds positive.  Musculoskeletal: no cyanosis or clubbing; decreased range of motion on his left knee, but no frank pain reported.  Good muscle tone.  No lower extremity edema appreciated. Skin: Diffuse rash affecting legs and trunk anterior and posterior aspect; no open wounds.  Left knee recent open arthropathy has healed and there is no signs of swelling or erythematous changes.  Positive blepharitis.  Please refer to epic media section for images. Neurologic: CN 2-12 grossly intact. Sensation intact, DTR normal. Strength 5/5 in all 4.  Psychiatric: Normal judgment and insight. Alert and oriented x 3. Normal mood.   Labs on Admission: I have personally reviewed following labs and imaging studies  CBC: Recent Labs  Lab 04/26/20 0751  WBC 6.0  NEUTROABS 3.7  HGB 9.8*  HCT 31.1*  MCV 85.7  PLT 509    Basic Metabolic Panel: Recent Labs  Lab 04/26/20 0751  NA 128*  K 4.5  CL 100  CO2 19*  GLUCOSE 103*  BUN 36*  CREATININE 3.36*  CALCIUM 7.3*    GFR: Estimated Creatinine Clearance: 25.8 mL/min (A) (by C-G formula based on SCr of 3.36 mg/dL (H)).  Liver Function Tests: Recent Labs  Lab 04/26/20 0751  AST 29  ALT 28  ALKPHOS 51  BILITOT 0.5  PROT 4.9*  ALBUMIN 2.3*    Urine analysis:    Component Value Date/Time   COLORURINE YELLOW 04/26/2020 0850   APPEARANCEUR CLEAR 04/26/2020 0850   LABSPEC 1.006 04/26/2020 0850   PHURINE 5.0 04/26/2020 0850   GLUCOSEU NEGATIVE 04/26/2020 0850   HGBUR NEGATIVE 04/26/2020 0850   [place] NEGATIVE 04/26/2020 0850   [place] 04/26/2020 0850   PROTEINUR NEGATIVE 04/26/2020 0850   NITRITE NEGATIVE 04/26/2020 0850   LEUKOCYTESUR NEGATIVE 04/26/2020 0850    Radiological Exams on Admission: US RENAL  Result Date: 04/26/2020 CLINICAL  DATA:  Acute renal failure. EXAM: RENAL / URINARY TRACT ULTRASOUND COMPLETE COMPARISON:  CT 04/06/2012 FINDINGS: Right Kidney: Renal measurements: 11.1 x 6.4 x 5.7 cm = volume: 214 mL. Echogenicity slightly increased. No mass or hydronephrosis visualized. Left Kidney: Renal measurements: 12.2 x 6.4 x 6.2 cm = volume: 257 mL. Echogenicity slightly increased. No mass or hydronephrosis visualized. Bladder: Appears normal for degree of bladder distention. Other: None. IMPRESSION: Slightly enlarged, slightly echogenic kidneys suggesting acute nephritis. No obstruction. Electronically Signed   By: Nelson Chimes M.D.   On: 04/26/2020 12:03    Assessment/Plan 1-acute renal failure (ARF) ([place]) -With high concern for acute interstitial nephritis -Also contributing is to continue use of nephrotoxic agents, dehydration and hypotension. -Agents that could potentially precipitate problems includes: Use of vancomycin and Rocephin.  This medication has been discontinued at this time. -Renal ultrasound demonstrating acute nephritis changes, no hydronephrosis or obstructive uropathy. -Urinalysis without signs of acute infection. -Will check ESR, CRP, ANA, ANCA, complement levels, antidouble-stranded DNA and follow renal function panel. -ARB has been discontinued, avoid hypotension and provide fluid resuscitation  2-Septic infrapatellar bursitis of left knee -No erythematous changes, no swelling and no pain on examination. -Antibiotic has been held in the setting of "red man" syndrome and acute renal failure. -ID has been curbside (Dr. Tommy Medal); okay to hold antibiotics for now.  3-history of HLD (hyperlipidemia) -Following low-fat diet as an outpatient -Currently using Pravachol, with plans to resume it at discharge. -Will check lipid panel.  4-HTN (hypertension) -Presenting currently with hypotension -Will hold antihypertensive agents. -Follow vital signs.  5-RA (rheumatoid arthritis) (HCC) -Holding  methotrexate currently -Patient also on Humira every 14 days as an outpatient.   DVT prophylaxis: Heparin. Code Status:   Full code Family Communication:  Wife at bedside. Disposition Plan:   Patient is from:  Home  Anticipated DC to:  Home  Anticipated DC date:  2 days  Anticipated DC barriers: stabilization of renal function Consults called:  Nephrology service.  Dr. Hollie Salk. Admission status:  MedSurg bed, inpatient status; length of stay more than 2 midnights.  Severity of Illness: Moderate severity, high chances for acute rapid decompensation.  Patient presenting with acute renal failure and concerns for acute nephritis.  Most likely acute interstitial nephritis.  Nephrology service has been consulted.  Work-up in process.  Potential agents involved in causing the problem has been discontinued.  Treatment for acute kidney injury will be provided with fluid resuscitation, avoiding hypotension and nephrotoxic medications.    Barton Dubois MD [facility]  How to contact the [facility] Attending or Consulting provider [place] or covering provider during after hours [place], for this patient?   1. Check the care team in [facility] and look for a) attending/consulting TRH provider listed and b) the [facility] team listed 2. Log into [email or web address] and use [place]'s universal password to access. If you do not have the password, please contact the hospital operator. 3. Locate the [facility] provider you are looking for under [facility] and page to a number that you can be directly reached. 4. If you still have difficulty reaching the provider, please page the [facility] (Director on Call) for the Hospitalists listed on amion for assistance.  04/26/2020, 6:38 PM

## 2020-04-27 ENCOUNTER — Inpatient Hospital Stay: Payer: BC Managed Care – PPO | Admitting: Family

## 2020-04-27 LAB — RENAL FUNCTION PANEL
Albumin: 2.3 g/dL — ABNORMAL LOW (ref 3.5–5.0)
Anion gap: 8 (ref 5–15)
BUN: 32 mg/dL — ABNORMAL HIGH (ref 6–20)
CO2: 18 mmol/L — ABNORMAL LOW (ref 22–32)
Calcium: 7.8 mg/dL — ABNORMAL LOW (ref 8.9–10.3)
Chloride: 111 mmol/L (ref 98–111)
Creatinine, Ser: 2.44 mg/dL — ABNORMAL HIGH (ref 0.61–1.24)
GFR calc Af Amer: 33 mL/min — ABNORMAL LOW (ref >60)
GFR calc non Af Amer: 28 mL/min — ABNORMAL LOW (ref >60)
Glucose, Bld: 105 mg/dL — ABNORMAL HIGH (ref 70–99)
Phosphorus: 3.5 mg/dL (ref 2.5–4.6)
Potassium: 4.3 mmol/L (ref 3.5–5.1)
Sodium: 137 mmol/L (ref 135–145)

## 2020-04-27 LAB — PROTEIN / CREATININE RATIO, URINE
Creatinine, Urine: 62.98 mg/dL
Protein Creatinine Ratio: 0.14 mg/mg{Cre} (ref 0.00–0.15)
Total Protein, Urine: 9 mg/dL

## 2020-04-27 LAB — FERRITIN: Ferritin: 465 ng/mL — ABNORMAL HIGH (ref 24–336)

## 2020-04-27 LAB — MAGNESIUM: Magnesium: 1.9 mg/dL (ref 1.7–2.4)

## 2020-04-27 LAB — VANCOMYCIN, TROUGH: Vancomycin Tr: 5 ug/mL — ABNORMAL LOW (ref 15–20)

## 2020-04-27 LAB — LIPID PANEL
Cholesterol: 108 mg/dL (ref 0–200)
HDL: 15 mg/dL — ABNORMAL LOW (ref >40)
LDL Cholesterol: 57 mg/dL (ref 0–99)
Total CHOL/HDL Ratio: 7.2 RATIO
Triglycerides: 180 mg/dL — ABNORMAL HIGH (ref <150)
VLDL: 36 mg/dL (ref 0–40)

## 2020-04-27 LAB — IRON AND TIBC
Iron: 71 ug/dL (ref 45–182)
Saturation Ratios: 37 % (ref 17.9–39.5)
TIBC: 194 ug/dL — ABNORMAL LOW (ref 250–450)
UIBC: 123 ug/dL

## 2020-04-27 LAB — HIV ANTIBODY (ROUTINE TESTING W REFLEX): HIV Screen 4th Generation wRfx: NONREACTIVE

## 2020-04-27 MED ORDER — FAMOTIDINE 20 MG PO TABS
20.0000 mg | ORAL_TABLET | Freq: Every day | ORAL | Status: DC
  Administered 2020-04-28 – 2020-04-29 (×2): 20 mg via ORAL
  Filled 2020-04-27 (×2): qty 1

## 2020-04-27 MED ORDER — SODIUM CHLORIDE 0.9 % IV SOLN
6.0000 mg/kg | Freq: Every day | INTRAVENOUS | Status: DC
  Administered 2020-04-27 – 2020-04-29 (×3): 469 mg via INTRAVENOUS
  Filled 2020-04-27 (×3): qty 9.38

## 2020-04-27 MED ORDER — METHYLPREDNISOLONE SODIUM SUCC 40 MG IJ SOLR
40.0000 mg | Freq: Two times a day (BID) | INTRAMUSCULAR | Status: DC
  Administered 2020-04-27 – 2020-04-29 (×5): 40 mg via INTRAVENOUS
  Filled 2020-04-27 (×5): qty 1

## 2020-04-27 MED ORDER — SODIUM CHLORIDE 0.9 % IV SOLN: INTRAVENOUS | Status: AC

## 2020-04-27 NOTE — Progress Notes (Signed)
Pharmacy Antibiotic Note  Eric Murray is a 59 y.o. male admitted on 04/26/2020 with septic arthritis .  Pharmacy has been consulted for daptomycin dosing.  Plan: Daptomycin 469 mg (6 mg/kg) IV every 24 hours. Monitor labs, c/s, and patient improvement.  Height: 5\' 11"  (180.3 cm) Weight: 82.5 kg (181 lb 14.1 oz) IBW/kg (Calculated) : 75.3  Temp (24hrs), Avg:97.9 F (36.6 C), Min:97.7 F (36.5 C), Max:98.2 F (36.8 C)  Recent Labs  Lab 04/26/20 0751 04/26/20 1919 04/27/20 0741  WBC 6.0  --   --   CREATININE 3.36*  --  2.44*  VANCOTROUGH  --  5*  --     Estimated Creatinine Clearance: 35.6 mL/min (A) (by C-G formula based on SCr of 2.44 mg/dL (H)).    Allergies  Allergen Reactions   Vancomycin     Red Man Syndrome     Antimicrobials this admission: Dapto 8/20 >>     Dose adjustments this admission: Monitor for adjustments  Microbiology results: 8/19 BCx: ngtd   Thank you for allowing pharmacy to be a part of this patients care.  Margot Ables, PharmD Clinical Pharmacist 04/27/2020 2:06 PM

## 2020-04-27 NOTE — Progress Notes (Signed)
PROGRESS NOTE    Eric Murray  FTD:[id number] DOB: 1960-12-10 DOA: 04/26/2020 PCP: Leanna Battles, MD    Chief Complaint  Patient presents with  . Allergic Reaction    Brief Narrative:  Eric Murray is a 59 y.o. male with medical history significant of hypertension, hyperlipidemia, rheumatoid arthritis and recent left knee septic arthritis/bursitis.  Who presented to the hospital secondary to disseminated rash, swelling around his eyes and feeling fatigue.  Patient denies any fever, chills, nausea, vomiting, chest pain, shortness of breath, dysuria, hematuria, melena, hematochezia or any other complaints.  Reports having antibiotics stopped recently secondary to elevated vancomycin trough, development of "red man" syndrome and swelling around his eyes with concern for allergic reaction.  Patient expressed taking the rest of his medication without missing any dose and had decreased appetite and oral intake for the last 3 to 4 days.  No sick contacts.  ED Course: Work-up demonstrating acute renal failure, hypertension and no signs of acute infection.  Given concern for allergy reaction steroids, Benadryl and Pepcid were provided on arrival.  Cultures a urinalysis taken.  TRH consulted on the patient for further evaluation and management.   Assessment & Plan: 1-acute renal failure -With high concern for interstitial nephritis -Other factors contributing includes prerenal azotemia, dehydration, continue use of nephrotoxic agents. -No signs of infection or obstructive uropathy and renal ultrasound -Follow final results of the work-up recommended by nephrology service -Continue IV fluids at adjusted rate and asked patient to maintain adequate nutrition and hydration -Continue to follow renal function panel.  2-septic infrapatellar bursitis of the left knee -Case discussed with infectious disease -Recommendation for daptomycin x1 week provided. -Pharmacy to adjust  dose.  3-"red man" syndrome/allergic reaction -Patient will be treated with Pepcid on daily basis and twice a day Solu-Medrol -Follow response.  4-HLD (hyperlipidemia) -Resume statins at discharge.  5-HTN (hypertension) -Improved after fluid resuscitation, still soft -Continue home vent antihypertensive agents.  6-RA (rheumatoid arthritis) (HCC) -Resume Humira and methotrexate at discharge. -no acute flares appreciated currently.     DVT prophylaxis: Heparin Code Status: Full code Family Communication: No family at bedside Disposition:   Status is: Inpatient  Dispo: The patient is from: Home              Anticipated d/c is to: Home              Anticipated d/c date is: 1-2 days              Patient currently Not medically stable for discharge; still requiring treatment and work-up for his acute renal failure and has now been started on antibiotics (pending response) for underlying septic joint/bursitis of his left knee.   Consultants:   Nephrology service  Infectious disease (Dr. Tommy Medal curbside)  Procedures:  See below for x-ray reports   Antimicrobials:  Daptomycin anticipated treatment for 1 week.   Subjective: No chest pain, no nausea, no vomiting, currently afebrile.  Still with diffuse redness and positive periorbital swelling.  Reports good urine output, no dysuria, no hematuria.  Objective: Vitals:   04/26/20 2014 04/27/20 0011 04/27/20 0614 04/27/20 1034  BP:  100/60 100/65 98/62  Pulse: 82 73 71 73  Resp:  16 20 18   Temp:  98.1 F (36.7 C) 97.7 F (36.5 C) 97.9 F (36.6 C)  TempSrc:  Oral Oral Oral  SpO2: 99% 99% (!) 89% 98%  Weight:      Height:        Intake/Output  Summary (Last 24 hours) at 04/27/2020 1537 Last data filed at 04/27/2020 0600 Gross per 24 hour  Intake 3663.65 ml  Output --  Net 3663.65 ml   Filed Weights   04/26/20 0659 04/26/20 1653  Weight: 79.8 kg 82.5 kg    Examination:  General exam: Appears calm and  comfortable, currently afebrile, denies chest pain, no shortness of breath.  Reports feeling slightly better.  Still with diffuse red man erythematous changes and periorbital swelling.  Reports good urine output. Respiratory system: Clear to auscultation. Respiratory effort normal. Cardiovascular system: S1 & S2 heard, RRR. No JVD, murmurs, rubs, gallops or clicks. No pedal edema. Gastrointestinal system: Abdomen is nondistended, soft and nontender. No organomegaly or masses felt. Normal bowel sounds heard. Central nervous system: Alert and oriented. No focal neurological deficits. Extremities: No cyanosis or clubbing. Skin: Positive periorbital swelling and blepharitis; please refer to epic media for images of his spread Red Man erythematous changes. Psychiatry: Judgement and insight appear normal. Mood & affect appropriate.     Data Reviewed: I have personally reviewed following labs and imaging studies  CBC: Recent Labs  Lab 04/26/20 0751  WBC 6.0  NEUTROABS 3.7  HGB 9.8*  HCT 31.1*  MCV 85.7  PLT 161    Basic Metabolic Panel: Recent Labs  Lab 04/26/20 0751 04/27/20 0741  NA 128* 137  K 4.5 4.3  CL 100 111  CO2 19* 18*  GLUCOSE 103* 105*  BUN 36* 32*  CREATININE 3.36* 2.44*  CALCIUM 7.3* 7.8*  MG  --  1.9  PHOS  --  3.5    GFR: Estimated Creatinine Clearance: 35.6 mL/min (A) (by C-G formula based on SCr of 2.44 mg/dL (H)).  Liver Function Tests: Recent Labs  Lab 04/26/20 0751 04/27/20 0741  AST 29  --   ALT 28  --   ALKPHOS 51  --   BILITOT 0.5  --   PROT 4.9*  --   ALBUMIN 2.3* 2.3*    CBG: No results for input(s): GLUCAP in the last 168 hours.   Recent Results (from the past 240 hour(s))  Culture, blood (single) w Reflex to ID Panel     Status: None (Preliminary result)   Collection Time: 04/26/20  7:51 AM   Specimen: BLOOD  Result Value Ref Range Status   Specimen Description BLOOD RIGHT HAND  Final   Special Requests   Final    BOTTLES DRAWN  AEROBIC AND ANAEROBIC Blood Culture adequate volume   Culture   Final    NO GROWTH < 24 HOURS Performed at [facility], [address], [place], [place] [address]    Report Status PENDING  Incomplete  SARS Coronavirus 2 by RT PCR (hospital order, performed in [place] hospital lab) Nasopharyngeal Nasopharyngeal Swab     Status: None   Collection Time: 04/26/20  8:57 AM   Specimen: Nasopharyngeal Swab  Result Value Ref Range Status   SARS Coronavirus 2 NEGATIVE NEGATIVE Final    Comment: (NOTE) SARS-CoV-2 target nucleic acids are NOT DETECTED.  The SARS-CoV-2 RNA is generally detectable in upper and lower respiratory specimens during the acute phase of infection. The lowest concentration of SARS-CoV-2 viral copies this assay can detect is 250 copies / mL. A negative result does not preclude SARS-CoV-2 infection and should not be used as the sole basis for treatment or other patient management decisions.  A negative result may occur with improper specimen collection / handling, submission of specimen other than nasopharyngeal swab, presence  of viral mutation(s) within the areas targeted by this assay, and inadequate number of viral copies (<250 copies / mL). A negative result must be combined with clinical observations, patient history, and epidemiological information.  Fact Sheet for Patients:   StrictlyIdeas.no  Fact Sheet for Healthcare Providers: BankingDealers.co.za  This test is not yet approved or  cleared by the [place] FDA and has been authorized for detection and/or diagnosis of SARS-CoV-2 by FDA under an Emergency Use Authorization (EUA).  This EUA will remain in effect (meaning this test can be used) for the duration of the COVID-19 declaration under Section 564(b)(1) of the Act, 21 U.S.C. section 360bbb-3(b)(1), unless the authorization is terminated or revoked sooner.  Performed at [facility], [address], [place], [place] [address]      Radiology Studies: US RENAL  Result Date: 04/26/2020 CLINICAL DATA:  Acute renal failure. EXAM: RENAL / URINARY TRACT ULTRASOUND COMPLETE COMPARISON:  CT 04/06/2012 FINDINGS: Right Kidney: Renal measurements: 11.1 x 6.4 x 5.7 cm = volume: 214 mL. Echogenicity slightly increased. No mass or hydronephrosis visualized. Left Kidney: Renal measurements: 12.2 x 6.4 x 6.2 cm = volume: 257 mL. Echogenicity slightly increased. No mass or hydronephrosis visualized. Bladder: Appears normal for degree of bladder distention. Other: None. IMPRESSION: Slightly enlarged, slightly echogenic kidneys suggesting acute nephritis. No obstruction. Electronically Signed   By: Nelson Chimes M.D.   On: 04/26/2020 12:03    Scheduled Meds: . heparin injection (subcutaneous)  5,000 Units Subcutaneous Q8H  . methylPREDNISolone (SOLU-MEDROL) injection  40 mg Intravenous Q12H   Continuous Infusions: . sodium chloride 75 mL/hr at 04/27/20 1032  . DAPTOmycin (CUBICIN)  IV       LOS: 1 day    Time spent: 35 minutes.    Barton Dubois, MD [facility]   To contact the attending provider between 7A-7P or the covering provider during after hours 7P-7A, please log into the web site [email or web address] and access using universal [place] password for that web site. If you do not have the password, please call the hospital operator.  04/27/2020, 3:37 PM

## 2020-04-27 NOTE — Consult Note (Addendum)
[facility] Renal Consultation Note  Requesting MD: Barton Dubois, MD Indication for Consultation: Acute kidney injury  Chief complaint: Fatigue and swelling around his eyes  HPI:  Eric Murray is a 59 y.o. male with a history including hypertension, rheumatoid arthritis, hyperlipidemia, and recent septic left knee who presented to the hospital after noting increased swelling around his eyes and fatigue.  Nephrology is consulted for assistance with management of AKI as creatinine on 8/19 was noted to be 3.36.  This is improved to 2.44 on 8/20.  Note that his baseline as of July 30 was 1 or less as noted below.  Home medications include BC powders per list and he has taken these in the past but hasn't used any used any recently.  He has also been on Avapro.  He had previously been getting vancomycin IV every 12 hours for his septic knee but this has been off a couple of weeks and he has been off of ceftriaxone for a couple of weeks.  They have been adjusting his antibiotics and eventually discontinued his antibiotics due to a diffuse rash which has been pronounced over the past week.  He states they have told him he may have "red man syndrome".  He had itching after starting vancomycin and was told to expect this and so he did not think much of it.  Then the rash developed about a week ago.  Note that per his history and imaging, on-call nephrology last night recommended that serologies be sent.  Work-up includes urinalysis which has returned as bland.  Work-up also notable for renal ultrasound notable for right kidney 11.1 cm, Left kidney 12.2 cm and no hydronephrosis.  Comment was made that kidneys were slightly enlarged and slightly echogenic suggesting acute nephritis.  Vancomycin level was low at 5 yesterday at 19:19.  Covid screen negative.  He has been on normal saline at 125 ml an hour.  No family history of ESRD.  Creatinine, Ser  Date/Time Value Ref Range Status  04/27/2020  07:41 AM 2.44 (H) 0.61 - 1.24 mg/dL Final  04/26/2020 07:51 AM 3.36 (H) 0.61 - 1.24 mg/dL Final  04/06/2020 04:23 AM 0.78 0.61 - 1.24 mg/dL Final  04/04/2020 11:04 AM 0.79 0.61 - 1.24 mg/dL Final  04/06/2012 08:00 AM 1.10 0.50 - 1.35 mg/dL Final    PMHx:   Past Medical History:  Diagnosis Date  . Arthritis   . Benign essential HTN   . Colon polyps    TA 2009  . History of kidney stones   . Hyperlipidemia   . Rheumatoid arthritis [facility])     Past Surgical History:  Procedure Laterality Date  . arm fracture surgery     as child   . COLONOSCOPY    . HERNIA REPAIR    . I & D EXTREMITY Left 04/04/2020   Procedure: LEFT KNEE OPEN DEBRIDEMENT;  Surgeon: Newt Minion, MD;  Location: [facility];  Service: Orthopedics;  Laterality: Left;  . MENISCUS REPAIR Left   . POLYPECTOMY    . SYNOVECTOMY Left 04/04/2020   LEFT KNEE OPEN synovectomy both anterior posterior and medial and lateral gutters    Family Hx:  Family History  Problem Relation Age of Onset  . Cataracts Mother 39       Pt died of old age  . Pancreatic cancer Father 30  . Prostate cancer Father   . Breast cancer Sister   . Cushing syndrome Brother   . Colon cancer Neg Hx   .  Colon polyps Neg Hx     Social History:  reports that he quit smoking about 12 months ago. His smoking use included cigarettes. He smoked 1.00 pack per day. He has never used smokeless tobacco. He reports current alcohol use of about 18.0 standard drinks of alcohol per week. He reports that he does not use drugs.  Allergies:  Allergies  Allergen Reactions  . Vancomycin     Red Man Syndrome     Medications: Prior to Admission medications   Medication Sig Start Date End Date Taking? Authorizing Provider  Adalimumab (HUMIRA) 40 MG/0.4ML PSKT Inject 40 mg into the skin every 14 (fourteen) days.   Yes [provider]  amLODipine (NORVASC) 5 MG tablet Take 5 mg by mouth daily.  06/03/16  Yes [provider]  Aspirin-Caffeine (BC  FAST PAIN RELIEF PO) Take 1 packet by mouth daily as needed (pain).   Yes [provider]  irbesartan (AVAPRO) 150 MG tablet Take 150 mg by mouth daily.  09/05/17  Yes [provider]  oxyCODONE-acetaminophen (PERCOCET/ROXICET) 5-325 MG tablet Take 1 tablet by mouth every 4 (four) hours as needed. 04/07/20  Yes Newt Minion, MD  pravastatin (PRAVACHOL) 40 MG tablet Take 40 mg by mouth daily.  06/03/16  Yes [provider]  cefTRIAXone (ROCEPHIN) IVPB Inject 2 g into the vein daily for 26 days. Indication:  Septic joint infection First Dose: no Last Day of Therapy:  05/02/2020 Labs - Once weekly:  CBC/D and BMP, Labs - Every other week:  ESR and CRP Method of administration: IV Push Method of administration may be changed at the discretion of home infusion pharmacist based upon assessment of the patient and/or caregiver's ability to self-administer the medication ordered. Patient not taking: Reported on 04/26/2020 04/07/20 05/03/20  Newt Minion, MD  methotrexate (RHEUMATREX) 2.5 MG tablet Take 15 mg by mouth every Saturday. Caution:Chemotherapy. Protect from light. Patient not taking: Reported on 04/26/2020    [provider]  vancomycin IVPB Inject 1,250 mg into the vein every 12 (twelve) hours for 26 days. Indication:  Septic joint infection First Dose: no Last Day of Therapy:  05/02/2020 Labs - Sunday/Monday:  CBC/D, BMP, and vancomycin trough. Labs - Thursday:  BMP and vancomycin trough Labs - Every other week:  ESR and CRP Method of administration:Elastomeric Method of administration may be changed at the discretion of the patient and/or caregiver's ability to self-administer the medication ordered. Patient not taking: Reported on 04/26/2020 04/07/20 05/03/20  Newt Minion, MD    I have reviewed the patient's current and prior to admission medications.  Labs:  BMP Latest Ref Rng & Units 04/27/2020 04/26/2020 04/06/2020  Glucose 70 - 99 mg/dL 105(H) 103(H)  87  BUN 6 - 20 mg/dL 32(H) 36(H) 12  Creatinine 0.61 - 1.24 mg/dL 2.44(H) 3.36(H) 0.78  Sodium 135 - 145 mmol/L 137 128(L) 141  Potassium 3.5 - 5.1 mmol/L 4.3 4.5 4.2  Chloride 98 - 111 mmol/L 111 100 106  CO2 22 - 32 mmol/L 18(L) 19(L) 28  Calcium 8.9 - 10.3 mg/dL 7.8(L) 7.3(L) 8.6(L)    Urinalysis    Component Value Date/Time   COLORURINE YELLOW 04/26/2020 [place] 04/26/2020 0850   LABSPEC 1.006 04/26/2020 0850   PHURINE 5.0 04/26/2020 0850   GLUCOSEU NEGATIVE 04/26/2020 0850   HGBUR NEGATIVE 04/26/2020 0850   BILIRUBINUR NEGATIVE 04/26/2020 0850   KETONESUR NEGATIVE 04/26/2020 0850   PROTEINUR NEGATIVE 04/26/2020 0850   NITRITE NEGATIVE  04/26/2020 0850   LEUKOCYTESUR NEGATIVE 04/26/2020 0850     ROS:  Pertinent items noted in HPI and remainder of comprehensive ROS otherwise negative.    Physical Exam: Vitals:   04/27/20 0011 04/27/20 0614  BP: 100/60 100/65  Pulse: 73 71  Resp: 16 20  Temp: 98.1 F (36.7 C) 97.7 F (36.5 C)  SpO2: 99% (!) 89%     General: Adult male in bed in no acute distress at rest HEENT: Normocephalic atraumatic Eyes: Periorbital edema is pronounced Neck: Supple trachea midline Heart: S1-S2 no rub appreciated Lungs: Clear to auscultation bilaterally normal work of breathing at rest Abdomen: Soft nontender nondistended Extremities: No lower extremity edema appreciated no cyanosis or clubbing.  Healed incision over the left knee Skin: Diffuse nonblanching erythematous rash on arms legs and torso and on arms almost appears as a sunburn. Neuro: Alert and oriented x3 provides a history and follows commands Psych normal mood and affect  Assessment/Plan:  # Acute kidney injury - Multifactorial in the setting of hypotension, vancomycin, and irbesartan.  Hx of elevated vanc level in clinic.  Note vanc level of 5 with stopping two weeks ago.  Note that history and imaging serologies were sent.   - Follow-up on complement, ANA,  anti-DNA, ANCA and HIV ab were sent.   - Note urine cytology/non-pap (may be eos?) was ordered as well but doesn't appear to have been sent - will d/w nurse - He is improving with supportive care.  Would hold irbesartan and choose an alternative to vancomycin - Please avoid any further Goody powders or other nonsteroidals - Would continue normal saline - have reduced rate of normal saline to 75 ml/hour for 24 hours and reassess tomorrow based on labs and physical exam - renal panel daily - UA bland however given the pronounced periorbital edema and AKI will send a up/cr ratio  # Hypotension  - Note hx hypertension -Would avoid irbesartan - Normal saline as above  # Diffuse rash  -Supportive care per primary team.  Patient states he has been taken off of vancomycin and ceftriaxone  # Septic left knee  - If another agent is felt non-inferior, would choose an alternative to vancomycin in setting of recovering AKI; abx per primary team   # Anemia - normocytic  - check iron panel - added on   # Hyponatremia  - mild and resolved  # Hypoxia - May be an error.  Discussing vitals with nurse and repeating; supplemental oxygen if needed   Disposition would continue inpatient monitoring.   Claudia Desanctis 04/27/2020, 10:26 AM

## 2020-04-28 LAB — RENAL FUNCTION PANEL
Albumin: 2.4 g/dL — ABNORMAL LOW (ref 3.5–5.0)
Anion gap: 5 (ref 5–15)
BUN: 29 mg/dL — ABNORMAL HIGH (ref 6–20)
CO2: 20 mmol/L — ABNORMAL LOW (ref 22–32)
Calcium: 7.8 mg/dL — ABNORMAL LOW (ref 8.9–10.3)
Chloride: 112 mmol/L — ABNORMAL HIGH (ref 98–111)
Creatinine, Ser: 1.97 mg/dL — ABNORMAL HIGH (ref 0.61–1.24)
GFR calc Af Amer: 42 mL/min — ABNORMAL LOW (ref >60)
GFR calc non Af Amer: 37 mL/min — ABNORMAL LOW (ref >60)
Glucose, Bld: 133 mg/dL — ABNORMAL HIGH (ref 70–99)
Phosphorus: 3.4 mg/dL (ref 2.5–4.6)
Potassium: 4.5 mmol/L (ref 3.5–5.1)
Sodium: 137 mmol/L (ref 135–145)

## 2020-04-28 LAB — ANTI-DNA ANTIBODY, DOUBLE-STRANDED: ds DNA Ab: <1 IU/mL (ref 0–9)

## 2020-04-28 LAB — CK: Total CK: 29 U/L — ABNORMAL LOW (ref 49–397)

## 2020-04-28 LAB — C4 COMPLEMENT: Complement C4, Body Fluid: 24 mg/dL (ref 12–38)

## 2020-04-28 LAB — ANA: Anti Nuclear Antibody (ANA): NEGATIVE

## 2020-04-28 LAB — C3 COMPLEMENT: C3 Complement: 130 mg/dL (ref 82–167)

## 2020-04-28 MED ORDER — DIPHENHYDRAMINE HCL 50 MG/ML IJ SOLN
25.0000 mg | Freq: Once | INTRAMUSCULAR | Status: AC
  Administered 2020-04-28: 25 mg via INTRAVENOUS
  Filled 2020-04-28: qty 1

## 2020-04-28 NOTE — Progress Notes (Signed)
  [facility] Progress Note    Assessment/ Plan:   Acute kidney injury, improving - Multifactorial in the setting of hypotension, vancomycin, and irbesartan.  Hx of elevated vanc level in clinic. - He is improving with supportive care.  Would hold irbesartan and on an alternative to vancomycin. Peak Cr 3.4 now 2. -c3, c4 wnl -ana, hiv neg -dsdna and anca pending - Note urine cytology/non-pap pending -no significant proteinuria. upc 0.1g -overall bland urine sediment - renal panel daily, dose meds for GFR<15 -Monitor Daily I/Os, Daily weight  -Maintain MAP>65 for optimal renal perfusion.  -Agree with holding ACE-I, avoid further nephrotoxins including NSAIDS, Morphine.  Unless absolutely necessary, avoid CT with contrast and/or MRI with gadolinium.     Hypotension, improving - Note hx hypertension -Would avoid irbesartan - Normal saline as needed  Metabolic Acidosis, non gap -improving, likely secondary to AKI  Diffuse rash  -Supportive care per primary team.  Patient states he has been taken off of vancomycin and ceftriaxone  Septic left knee  - If another agent is felt non-inferior, would choose an alternative to vancomycin in setting of recovering AKI; abx per primary team , currently on dapto (monitor CK's)  Anemia - normocytic  - iron sufficient on panel, avoiding iv iron in the setting of infection  Hyponatremia, resolved and stable   Eric Quint, MD [facility] 04/28/2020, 3:14 PM    Subjective:   Finished NS this am, feels good. No complaints.   Objective:   BP 107/78 (BP Location: Right Arm)   Pulse 72   Temp 97.7 F (36.5 C) (Oral)   Resp 16   Ht 5\' 11"  (1.803 m)   Wt 82.5 kg   SpO2 98%   BMI 25.37 kg/m   Intake/Output Summary (Last 24 hours) at 04/28/2020 1514 Last data filed at 04/28/2020 0300 Gross per 24 hour  Intake 1174.13 ml  Output --  Net 1174.13 ml   Weight change:   Physical Exam: Gen:nad,  comfortable appearing CVS:reg rate Resp:normal wob, unlabored, bl chest expansion, speaking in full sentences WEX:[id number] Skin: erythematous rash all extremities Ext: healed surgical scar left knee Neuro: speech clear and coherent, moves all ext spontaneously.  Imaging: No results found.  Labs: BMET Recent Labs  Lab 04/26/20 0751 04/27/20 0741 04/28/20 0636  NA 128* 137 137  K 4.5 4.3 4.5  CL 100 111 112*  CO2 19* 18* 20*  GLUCOSE 103* 105* 133*  BUN 36* 32* 29*  CREATININE 3.36* 2.44* 1.97*  CALCIUM 7.3* 7.8* 7.8*  PHOS  --  3.5 3.4   CBC Recent Labs  Lab 04/26/20 0751  WBC 6.0  NEUTROABS 3.7  HGB 9.8*  HCT 31.1*  MCV 85.7  PLT 302    Medications:    . famotidine  20 mg Oral Daily  . heparin injection (subcutaneous)  5,000 Units Subcutaneous Q8H  . methylPREDNISolone (SOLU-MEDROL) injection  40 mg Intravenous Q12H

## 2020-04-28 NOTE — Progress Notes (Addendum)
Pt stated his hands are now red and covered with the rash. Pt stated no numbness or tingling in his hands just redness. Pt began crying and high anxiety expressed he was tired of the BS. MD notified

## 2020-04-28 NOTE — Progress Notes (Signed)
PROGRESS NOTE    Eric Murray  JJK:[id number] DOB: [birth year] DOA: 04/26/2020 PCP: Leanna Battles, MD    Chief Complaint  Patient presents with  . Allergic Reaction    Brief Narrative:  Eric Murray is a 59 y.o. male with medical history significant of hypertension, hyperlipidemia, rheumatoid arthritis and recent left knee septic arthritis/bursitis.  Who presented to the hospital secondary to disseminated rash, swelling around his eyes and feeling fatigue.  Patient denies any fever, chills, nausea, vomiting, chest pain, shortness of breath, dysuria, hematuria, melena, hematochezia or any other complaints.  Reports having antibiotics stopped recently secondary to elevated vancomycin trough, development of "red man" syndrome and swelling around his eyes with concern for allergic reaction.  Patient expressed taking the rest of his medication without missing any dose and had decreased appetite and oral intake for the last 3 to 4 days.  No sick contacts.  ED Course: Work-up demonstrating acute renal failure, hypertension and no signs of acute infection.  Given concern for allergy reaction steroids, Benadryl and Pepcid were provided on arrival.  Cultures a urinalysis taken.  TRH consulted on the patient for further evaluation and management.   Assessment & Plan: 1-acute renal failure -With high concern for interstitial nephritis -Other factors contributing includes prerenal azotemia, dehydration, continue use of nephrotoxic agents. -No signs of infection or obstructive uropathy and renal ultrasound -Follow final results of the work-up recommended by nephrology service -Continue IV fluids at adjusted rate and asked patient to maintain adequate nutrition and hydration -Continue to follow renal function panel. -Creatinine trending down appropriately; 1.97 now this time.  2-septic infrapatellar bursitis of the left knee -Case discussed with infectious disease -Recommendation for  daptomycin x1 week provided; anticipated treatment plan 04/27/2020 to 05/04/2020.. -Pharmacy to adjust dose.  3-"red man" syndrome/allergic reaction -Patient will be treated with Pepcid on daily basis and twice a day Solu-Medrol -Follow response. -Follow clinical response.  4-HLD (hyperlipidemia) -Resume statins at discharge.  5-HTN (hypertension) -Improved after fluid resuscitation, still soft -Continue home vent antihypertensive agents.  6-RA (rheumatoid arthritis) (HCC) -Resume Humira and methotrexate at discharge. -no acute flares appreciated currently.   DVT prophylaxis: Heparin Code Status: Full code Family Communication: No family at bedside Disposition:   Status is: Inpatient  Dispo: The patient is from: Home              Anticipated d/c is to: Home              Anticipated d/c date is: 1-2 days              Patient currently Not medically stable for discharge; still requiring treatment and work-up for his acute renal failure and has now been started on antibiotics (pending response) for underlying septic joint/bursitis of his left knee.   Consultants:   Nephrology service  Infectious disease (Dr. Tommy Medal curbside)  Procedures:  See below for x-ray reports   Antimicrobials:  Daptomycin anticipated treatment for 1 week.   Subjective: No chest pain, no nausea, no vomiting, remains afebrile.  Diffuse erythematous rash appreciated on examination.  Patient feeling overwhelmed and frustrated with current condition.  Very anxious overnight.  Objective: Vitals:   04/27/20 1034 04/27/20 1600 04/27/20 1949 04/28/20 0555  BP: 98/62 91/66 103/76 107/78  Pulse: 73 81 79 72  Resp: 18 18 16 16   Temp: 97.9 F (36.6 C) 97.8 F (36.6 C) 98.2 F (36.8 C) 97.7 F (36.5 C)  TempSrc: Oral Oral Oral Oral  SpO2: 98%  100%  98%  Weight:      Height:        Intake/Output Summary (Last 24 hours) at 04/28/2020 1520 Last data filed at 04/28/2020 0300 Gross per 24 hour    Intake 1174.13 ml  Output --  Net 1174.13 ml   Filed Weights   04/26/20 0659 04/26/20 1653  Weight: 79.8 kg 82.5 kg    Examination: General exam: Alert, awake, oriented x 3, no fever, no chest pain, no nausea, no vomiting.  Still experiencing severe diffuse erythematous rash and expressed some spread into his hands.  Respiratory system: Clear to auscultation. Respiratory effort normal. Cardiovascular system:RRR. No murmurs, rubs, gallops.  No JVD. Gastrointestinal system: Abdomen is nondistended, soft and nontender. No organomegaly or masses felt. Normal bowel sounds heard. Central nervous system: Alert and oriented. No focal neurological deficits. Extremities: No cyanosis or clubbing. Skin: Diffuse erythematous rash; improved periorbital edema.  No open wounds.  Left knee wound from previous joint surgery appears to be healed, no drainage, no tenderness. Psychiatry: Judgement and insight appear normal. Mood & affect appropriate.    Data Reviewed: I have personally reviewed following labs and imaging studies  CBC: Recent Labs  Lab 04/26/20 0751  WBC 6.0  NEUTROABS 3.7  HGB 9.8*  HCT 31.1*  MCV 85.7  PLT 269    Basic Metabolic Panel: Recent Labs  Lab 04/26/20 0751 04/27/20 0741 04/28/20 0636  NA 128* 137 137  K 4.5 4.3 4.5  CL 100 111 112*  CO2 19* 18* 20*  GLUCOSE 103* 105* 133*  BUN 36* 32* 29*  CREATININE 3.36* 2.44* 1.97*  CALCIUM 7.3* 7.8* 7.8*  MG  --  1.9  --   PHOS  --  3.5 3.4    GFR: Estimated Creatinine Clearance: 44.1 mL/min (A) (by C-G formula based on SCr of 1.97 mg/dL (H)).  Liver Function Tests: Recent Labs  Lab 04/26/20 0751 04/27/20 0741 04/28/20 0636  AST 29  --   --   ALT 28  --   --   ALKPHOS 51  --   --   BILITOT 0.5  --   --   PROT 4.9*  --   --   ALBUMIN 2.3* 2.3* 2.4*    CBG: No results for input(s): GLUCAP in the last 168 hours.   Recent Results (from the past 240 hour(s))  Culture, blood (single) w Reflex to ID  Panel     Status: None (Preliminary result)   Collection Time: 04/26/20  7:51 AM   Specimen: BLOOD  Result Value Ref Range Status   Specimen Description BLOOD RIGHT HAND  Final   Special Requests   Final    BOTTLES DRAWN AEROBIC AND ANAEROBIC Blood Culture adequate volume   Culture   Final    NO GROWTH 2 DAYS Performed at [facility], [address], [place], [place] [address]    Report Status PENDING  Incomplete  SARS Coronavirus 2 by RT PCR (hospital order, performed in [place] hospital lab) Nasopharyngeal Nasopharyngeal Swab     Status: None   Collection Time: 04/26/20  8:57 AM   Specimen: Nasopharyngeal Swab  Result Value Ref Range Status   SARS Coronavirus 2 NEGATIVE NEGATIVE Final    Comment: (NOTE) SARS-CoV-2 target nucleic acids are NOT DETECTED.  The SARS-CoV-2 RNA is generally detectable in upper and lower respiratory specimens during the acute phase of infection. The lowest concentration of SARS-CoV-2 viral copies this assay can detect is 250 copies / mL. A  negative result does not preclude SARS-CoV-2 infection and should not be used as the sole basis for treatment or other patient management decisions.  A negative result may occur with improper specimen collection / handling, submission of specimen other than nasopharyngeal swab, presence of viral mutation(s) within the areas targeted by this assay, and inadequate number of viral copies (<250 copies / mL). A negative result must be combined with clinical observations, patient history, and epidemiological information.  Fact Sheet for Patients:   StrictlyIdeas.no  Fact Sheet for Healthcare Providers: BankingDealers.co.za  This test is not yet approved or  cleared by the [place] FDA and has been authorized for detection and/or diagnosis of SARS-CoV-2 by FDA under an Emergency Use Authorization (EUA).  This EUA will remain in effect (meaning this test can be  used) for the duration of the COVID-19 declaration under Section 564(b)(1) of the Act, 21 U.S.C. section 360bbb-3(b)(1), unless the authorization is terminated or revoked sooner.  Performed at [facility], [address] [place], [place] [address]      Radiology Studies: No results found.  Scheduled Meds: . famotidine  20 mg Oral Daily  . heparin injection (subcutaneous)  5,000 Units Subcutaneous Q8H  . methylPREDNISolone (SOLU-MEDROL) injection  40 mg Intravenous Q12H   Continuous Infusions: . DAPTOmycin (CUBICIN)  IV 469 mg (04/27/20 1618)     LOS: 2 days    Time spent: 35 minutes.    Barton Dubois, MD [facility]   To contact the attending provider between 7A-7P or the covering provider during after hours 7P-7A, please log into the web site [email or web address] and access using universal [place] password for that web site. If you do not have the password, please call the hospital operator.  04/28/2020, 3:20 PM

## 2020-04-29 LAB — RENAL FUNCTION PANEL
Albumin: 2.6 g/dL — ABNORMAL LOW (ref 3.5–5.0)
Anion gap: 8 (ref 5–15)
BUN: 28 mg/dL — ABNORMAL HIGH (ref 6–20)
CO2: 22 mmol/L (ref 22–32)
Calcium: 8.5 mg/dL — ABNORMAL LOW (ref 8.9–10.3)
Chloride: 108 mmol/L (ref 98–111)
Creatinine, Ser: 1.66 mg/dL — ABNORMAL HIGH (ref 0.61–1.24)
GFR calc Af Amer: 52 mL/min — ABNORMAL LOW (ref >60)
GFR calc non Af Amer: 45 mL/min — ABNORMAL LOW (ref >60)
Glucose, Bld: 136 mg/dL — ABNORMAL HIGH (ref 70–99)
Phosphorus: 3.6 mg/dL (ref 2.5–4.6)
Potassium: 4.6 mmol/L (ref 3.5–5.1)
Sodium: 138 mmol/L (ref 135–145)

## 2020-04-29 MED ORDER — PREDNISONE 20 MG PO TABS: 60.0000 mg | ORAL_TABLET | Freq: Every day | ORAL | 0 refills | Status: AC

## 2020-04-29 MED ORDER — FAMOTIDINE 20 MG PO TABS: 20.0000 mg | ORAL_TABLET | Freq: Two times a day (BID) | ORAL | 0 refills | Status: AC

## 2020-04-29 MED ORDER — HYDROXYZINE HCL 25 MG PO TABS: 25.0000 mg | ORAL_TABLET | Freq: Three times a day (TID) | ORAL | 0 refills | Status: AC | PRN

## 2020-04-29 MED ORDER — DAPTOMYCIN IV (FOR PTA / DISCHARGE USE ONLY): 470.0000 mg | INTRAVENOUS | 0 refills | Status: AC

## 2020-04-29 MED ORDER — HUMIRA (2 SYRINGE) 40 MG/0.4ML ~~LOC~~ PSKT: 40.0000 mg | PREFILLED_SYRINGE | SUBCUTANEOUS | Status: AC

## 2020-04-29 MED ORDER — POLYVINYL ALCOHOL 1.4 % OP SOLN
2.0000 [drp] | OPHTHALMIC | Status: DC | PRN
  Administered 2020-04-29: 2 [drp] via OPHTHALMIC
  Filled 2020-04-29: qty 15

## 2020-04-29 MED ORDER — HYDROXYZINE HCL 25 MG PO TABS
25.0000 mg | ORAL_TABLET | Freq: Three times a day (TID) | ORAL | Status: DC | PRN
  Administered 2020-04-29: 25 mg via ORAL
  Filled 2020-04-29: qty 1

## 2020-04-29 NOTE — Progress Notes (Signed)
PHARMACY CONSULT NOTE FOR:  OUTPATIENT  PARENTERAL ANTIBIOTIC THERAPY (OPAT)  Indication: Septic arthritis/bursitits of left knee Regimen: Daptomycin 470mg  IV q24h End date: 05/03/2020  IV antibiotic discharge orders are pended. To discharging provider:  please sign these orders via discharge navigator,  Select New Orders & click on the button choice - Manage This Unsigned Work.     Thank you for allowing pharmacy to be a part of this patient's care. Isac Sarna, BS [place], BCPS Clinical Pharmacist Pager [phone] 04/29/2020, 3:06 PM

## 2020-04-29 NOTE — Progress Notes (Signed)
  [facility] Progress Note    Assessment/ Plan:   Acute kidney injury, improving -baseline Cr ~0.8 - Multifactorial in the setting of hypotension, vancomycin, and irbesartan.  Hx of elevated vanc level in clinic. -improving with supportive care.  Would hold irbesartan and on an alternative to vancomycin. Peak Cr 3.4 now 1.7. -c3, c4, ck wnl -ana, hiv neg -dsdna ab neg -anca pending -urine cytology/non-pap pending -no significant proteinuria. upc 0.1g -overall bland urine sediment - renal panel daily, dose meds for GFR<15 -Monitor Daily I/Os, Daily weight  -Maintain MAP>65 for optimal renal perfusion.  -Agree with holding ACE-I, avoid further nephrotoxins including NSAIDS, Morphine.  Unless absolutely necessary, avoid CT with contrast and/or MRI with gadolinium.     Hypotension, improved - Note hx hypertension. bp stable. No needs for an anti-hypertensive at this junction -Would avoid irbesartan - Normal saline as needed  Metabolic Acidosis, non gap -improved, likely secondary to AKI -no need for oral bicarb as of right now  Diffuse rash  -Supportive care per primary team.  off of vancomycin and ceftriaxone  Septic left knee  - If another agent is felt non-inferior, would choose an alternative to vancomycin in setting of recovering AKI; abx per primary team , currently on dapto (monitor CK's)  Anemia - normocytic  - iron sufficient on panel, avoiding iv iron in the setting of infection  Hyponatremia, resolved and stable   Okay from nephrology perspective for discharge. Will sign off from our end. Thank you for allowing [place] to take care of this patient. Will set up him up for a follow up at our office in the next coming weeks. Needs to have a met panel checked at the very least with his PMD in the next few days post discharge.  Eric Quint, MD [facility]   Subjective:   Being discharged today, no acute events. Continues to feel well.  Has been working on hydration.   Objective:   BP 124/90 (BP Location: Left Arm)   Pulse 68   Temp (!) 97.5 F (36.4 C) (Oral)   Resp 16   Ht $R'5\' 11"'Jt$  (1.803 m)   Wt 82.5 kg   SpO2 98%   BMI 25.37 kg/m   Intake/Output Summary (Last 24 hours) at 04/29/2020 1139 Last data filed at 04/28/2020 2300 Gross per 24 hour  Intake 720 ml  Output --  Net 720 ml   Weight change:   Physical Exam: Gen:nad, comfortable appearing CVS:reg rate Resp:normal wob, unlabored, bl chest expansion, speaking in full sentences CBS:[id number] Skin: erythematous rash all extremities Ext: healed surgical scar left knee Neuro: speech clear and coherent, moves all ext spontaneously.  Imaging: No results found.  Labs: BMET Recent Labs  Lab 04/26/20 0751 04/27/20 0741 04/28/20 0636 04/29/20 0631  NA 128* 137 137 138  K 4.5 4.3 4.5 4.6  CL 100 111 112* 108  CO2 19* 18* 20* 22  GLUCOSE 103* 105* 133* 136*  BUN 36* 32* 29* 28*  CREATININE 3.36* 2.44* 1.97* 1.66*  CALCIUM 7.3* 7.8* 7.8* 8.5*  PHOS  --  3.5 3.4 3.6   CBC Recent Labs  Lab 04/26/20 0751  WBC 6.0  NEUTROABS 3.7  HGB 9.8*  HCT 31.1*  MCV 85.7  PLT 302    Medications:    . famotidine  20 mg Oral Daily  . heparin injection (subcutaneous)  5,000 Units Subcutaneous Q8H  . methylPREDNISolone (SOLU-MEDROL) injection  40 mg Intravenous Q12H

## 2020-04-29 NOTE — Discharge Summary (Signed)
Physician Discharge Summary  Eric [facility] MHD:[id number] DOB: 03/11/1961 DOA: 04/26/2020  PCP: Leanna Battles, MD  Admit date: 04/26/2020 Discharge date: 04/29/2020  Time spent: 35 minutes  Recommendations for Outpatient Follow-up:  1. Repeat basic metabolic panel to reassess renal function stability and creatinine trend. 2. Reassess blood pressure and adjust antihypertensive medication as required.   Discharge Diagnoses:  Principal Problem:   Acute renal failure (ARF) ([place]) Active Problems:   Septic infrapatellar bursitis of left knee   HLD (hyperlipidemia)   HTN (hypertension)   RA (rheumatoid arthritis) ([place])   Discharge Condition: Stable and improved.  Discharged home with instruction to follow-up with PCP and infectious disease service after discharge.  CODE STATUS: Full code  Diet recommendation: Heart healthy diet.  Filed Weights   04/26/20 0659 04/26/20 1653  Weight: 79.8 kg 82.5 kg    History of present illness:  Abhi Moccia Schuetzeis a 59 y.o.malewith medical history significant ofhypertension, hyperlipidemia, rheumatoid arthritis and recent left knee septic arthritis/bursitis. Who presented to the hospital secondary to disseminated rash, swelling around his eyes and feeling fatigue. Patient denies any fever, chills, nausea, vomiting, chest pain, shortness of breath, dysuria, hematuria, melena, hematochezia or any other complaints. Reports having antibiotics stopped recently secondary to elevated vancomycin trough,development of "red man" syndrome and swelling around his eyes with concern for allergic reaction. Patient expressed taking the rest of his medication without missing any dose and had decreased appetite and oral intake for the last 3 to 4 days. No sick contacts.  ED Course:Work-up demonstrating acute renal failure, hypertension and no signs of acute infection. Given concern for allergy reaction steroids, Benadryl and Pepcid were provided on  arrival. Cultures a urinalysis taken. TRH consulted on the patient for further evaluation and management.   Hospital Course:  1-acute renal failure -Continue minimizing/avoiding the use of nephrotoxic agents. -No signs of infection or obstructive uropathy and renal ultrasound -Patient advised to maintain adequate hydration. -Repeat basic metabolic panel follow-up visit to reassess stability and creatinine trend. -Creatinine trending down appropriately; 1.6 at time of discharge.  2-septic infrapatellar bursitis of the left knee -Case discussed with infectious disease -Recommendation for daptomycin x1 week provided; anticipated treatment plan 04/27/2020 to 05/03/2020.. -Pharmacy to adjust dose.  3-"red man" syndrome/allergic reaction -Patient will be treated with Pepcid on daily basis and steroids. -Follow clinical response.  4-HLD (hyperlipidemia) -Resume statins at discharge. -Heart healthy diet has been emphasized.  5-HTN (hypertension) -Improved after fluid resuscitation, but not high enough to require antihypertensive agents at this time -Will continue holding antihypertensive medications and to follow-up with PCP. -Heart healthy diet has been encouraged.  6-RA (rheumatoid arthritis) ([facility]) -Continue holding Humira and methotrexate until follow-up with rheumatology service.   Procedures:  See below for x-ray reports  Consultations:  Nephrology service  Infectious disease (Dr. Tommy Medal curbside)  Discharge Exam: Vitals:   04/29/20 0411 04/29/20 1424  BP: 124/90 119/72  Pulse: 68 72  Resp: 16 16  Temp: (!) 97.5 F (36.4 C) 97.6 F (36.4 C)  SpO2: 98% 100%    General: Afebrile, no chest pain, no nausea, no vomiting, no shortness of breath.  Patient demonstrated improvement in swelling around his eyes and skin erythematous changes.  Reports good urine output. Cardiovascular: S1 and S2, no rubs, no gallops, no murmurs, no JVD. Respiratory: Clear to  auscultation bilaterally; no using accessory muscles.  Good oxygen saturation on room air Abdomen: Soft, nontender, distended, positive bowel sounds Extremities: No cyanosis or clubbing.  Discharge Instructions  Discharge Instructions    [facility] pharmacist to adjust dose for Vancomycin, Aminoglycosides and other anti-infective therapies as requested by physician.   Complete by: As directed    [facility] to provide Cath Flo 37m   Complete by: As directed    Administer for PICC line occlusion and as ordered by physician for other access device issues.   Anaphylaxis Kit: Provided to treat any anaphylactic reaction to the medication being provided to the patient if First Dose or when requested by physician   Complete by: As directed    Epinephrine 12mml vial / amp: Administer 0.2m60m0.2ml82mubcutaneously once for moderate to severe anaphylaxis, nurse to call physician and pharmacy when reaction occurs and call 911 if needed for immediate care   Diphenhydramine 50mg72mIV vial: Administer 25-50mg 82mM PRN for first dose reaction, rash, itching, mild reaction, nurse to call physician and pharmacy when reaction occurs   Sodium Chloride 0.9% NS 500ml I22mdminister if needed for hypovolemic blood pressure drop or as ordered by physician after call to physician with anaphylactic reaction   Change dressing on IV access line weekly and PRN   Complete by: As directed    Diet - low sodium heart healthy   Complete by: As directed    Discharge instructions   Complete by: As directed    Maintain adequate hydration Avoid the use of NSAIDs and continue holding norvasc and losartan  Follow heart healthy diet Follow-up with PCP in 10 days Follow-up with infectious disease service (Dr. Van DamTommy Medalweek.   Flush IV access with Sodium Chloride 0.9% and Heparin 10 units/ml or 100 units/ml   Complete by: As directed    Home infusion instructions - [facility]    Complete by: As directed    Instructions: Flush IV access with Sodium Chloride 0.9% and Heparin 10units/ml or 100units/ml   Change dressing on IV access line: Weekly and PRN   Instructions Cath Flo 2mg: Ad48mister for PICC Line occlusion and as ordered by physician for other access device   [facility] pharmacist to adjust dose for: Vancomycin, Aminoglycosides and other anti-infective therapies as requested by physician   Method of administration may be changed at the discretion of home infusion pharmacist based upon assessment of the patient and/or caregiver's ability to self-administer the medication ordered   Complete by: As directed    No wound care   Complete by: As directed    No wound care   Complete by: As directed      Allergies as of 04/29/2020      Reactions   Vancomycin    Red Man Syndrome       Medication List    STOP taking these medications   amLODipine 5 MG tablet Commonly known as: NORVASC   BC FAST PAIN RELIEF PO   cefTRIAXone  IVPB Commonly known as: ROCEPHIN   irbesartan 150 MG tablet Commonly known as: AVAPRO   methotrexate 2.5 MG tablet Commonly known as: RHEUMATREX   vancomycin  IVPB     TAKE these medications   daptomycin  IVPB Commonly known as: CUBICIN Inject 470 mg into the vein daily for 4 days. Indication:  Septic arthritis/bursitis left knee First Dose: No Last Day of Therapy:  05/03/2020 Labs - Once weekly:  CBC/D, BMP, and CPK Labs - Every other week:  ESR and CRP Method of administration: IV Push Method of administration may be changed at the discretion of home infusion pharmacist  based upon assessment of the patient and/or caregiver's ability to self-administer the medication ordered.   famotidine 20 MG tablet Commonly known as: PEPCID Take 1 tablet (20 mg total) by mouth 2 (two) times daily.   Humira 40 MG/0.4ML Pskt Generic drug: Adalimumab Inject 40 mg into the skin every 14 (fourteen) days. Continue to hold until  follow up with rheumatology service What changed: additional instructions   hydrOXYzine 25 MG tablet Commonly known as: ATARAX/VISTARIL Take 1 tablet (25 mg total) by mouth every 8 (eight) hours as needed for itching.   oxyCODONE-acetaminophen 5-325 MG tablet Commonly known as: PERCOCET/ROXICET Take 1 tablet by mouth every 4 (four) hours as needed.   pravastatin 40 MG tablet Commonly known as: PRAVACHOL Take 40 mg by mouth daily.   predniSONE 20 MG tablet Commonly known as: Deltasone Take 3 tablets (60 mg total) by mouth daily with breakfast for 5 doses. Start taking on: April 30, 2020            Discharge Care Instructions  (From admission, onward)         Start     Ordered   04/29/20 0000  Change dressing on IV access line weekly and PRN  (Home infusion instructions - [facility] )        04/29/20 1532         Allergies  Allergen Reactions  . Vancomycin     Red Man Syndrome     Follow-up Information    [facility] Follow up.   Specialty: Home Health Services Why: [facility] resume Contact information: [place] [address] [phone]        [facility] Follow up.   Specialty: [place] Why: [facility] IV antibotics       Leanna Battles, MD. Schedule an appointment as soon as possible for a visit in 10 day(s).   Specialty: Internal Medicine Contact information: [place] [address] [phone]               The results of significant diagnostics from this hospitalization (including imaging, microbiology, ancillary and laboratory) are listed below for reference.    Significant Diagnostic Studies: US RENAL  Result Date: 04/26/2020 CLINICAL DATA:  Acute renal failure. EXAM: RENAL / URINARY TRACT ULTRASOUND COMPLETE COMPARISON:  CT 04/06/2012 FINDINGS: Right Kidney: Renal measurements: 11.1 x 6.4 x 5.7 cm = volume: 214 mL. Echogenicity  slightly increased. No mass or hydronephrosis visualized. Left Kidney: Renal measurements: 12.2 x 6.4 x 6.2 cm = volume: 257 mL. Echogenicity slightly increased. No mass or hydronephrosis visualized. Bladder: Appears normal for degree of bladder distention. Other: None. IMPRESSION: Slightly enlarged, slightly echogenic kidneys suggesting acute nephritis. No obstruction. Electronically Signed   By: Nelson Chimes M.D.   On: 04/26/2020 12:03   [place] EKG SITE RITE  Result Date: 04/05/2020 If Site Rite image not attached, placement could not be confirmed due to current cardiac rhythm.   Microbiology: Recent Results (from the past 240 hour(s))  Culture, blood (single) w Reflex to ID Panel     Status: None (Preliminary result)   Collection Time: 04/26/20  7:51 AM   Specimen: BLOOD  Result Value Ref Range Status   Specimen Description BLOOD RIGHT HAND  Final   Special Requests   Final    BOTTLES DRAWN AEROBIC AND ANAEROBIC Blood Culture adequate volume   Culture   Final    NO GROWTH 3 DAYS Performed at  [facility], [address] [place], [place] [address]    Report Status PENDING  Incomplete  SARS Coronavirus 2 by RT PCR (hospital order, performed in [facility] hospital lab) Nasopharyngeal Nasopharyngeal Swab     Status: None   Collection Time: 04/26/20  8:57 AM   Specimen: Nasopharyngeal Swab  Result Value Ref Range Status   SARS Coronavirus 2 NEGATIVE NEGATIVE Final    Comment: (NOTE) SARS-CoV-2 target nucleic acids are NOT DETECTED.  The SARS-CoV-2 RNA is generally detectable in upper and lower respiratory specimens during the acute phase of infection. The lowest concentration of SARS-CoV-2 viral copies this assay can detect is 250 copies / mL. A negative result does not preclude SARS-CoV-2 infection and should not be used as the sole basis for treatment or other patient management decisions.  A negative result may occur with improper specimen collection / handling, submission of  specimen other than nasopharyngeal swab, presence of viral mutation(s) within the areas targeted by this assay, and inadequate number of viral copies (<250 copies / mL). A negative result must be combined with clinical observations, patient history, and epidemiological information.  Fact Sheet for Patients:   StrictlyIdeas.no  Fact Sheet for Healthcare Providers: BankingDealers.co.za  This test is not yet approved or  cleared by the [place] FDA and has been authorized for detection and/or diagnosis of SARS-CoV-2 by FDA under an Emergency Use Authorization (EUA).  This EUA will remain in effect (meaning this test can be used) for the duration of the COVID-19 declaration under Section 564(b)(1) of the Act, 21 U.S.C. section 360bbb-3(b)(1), unless the authorization is terminated or revoked sooner.  Performed at [facility], [address] [place], [place] [address]      Labs: Basic Metabolic Panel: Recent Labs  Lab 04/26/20 0751 04/27/20 0741 04/28/20 0636 04/29/20 0631  NA 128* 137 137 138  K 4.5 4.3 4.5 4.6  CL 100 111 112* 108  CO2 19* 18* 20* 22  GLUCOSE 103* 105* 133* 136*  BUN 36* 32* 29* 28*  CREATININE 3.36* 2.44* 1.97* 1.66*  CALCIUM 7.3* 7.8* 7.8* 8.5*  MG  --  1.9  --   --   PHOS  --  3.5 3.4 3.6   Liver Function Tests: Recent Labs  Lab 04/26/20 0751 04/27/20 0741 04/28/20 0636 04/29/20 0631  AST 29  --   --   --   ALT 28  --   --   --   ALKPHOS 51  --   --   --   BILITOT 0.5  --   --   --   PROT 4.9*  --   --   --   ALBUMIN 2.3* 2.3* 2.4* 2.6*   CBC: Recent Labs  Lab 04/26/20 0751  WBC 6.0  NEUTROABS 3.7  HGB 9.8*  HCT 31.1*  MCV 85.7  PLT 302   Cardiac Enzymes: Recent Labs  Lab 04/28/20 0636  CKTOTAL 29*    Signed:  Barton Dubois MD.  [facility] 04/29/2020, 3:34 PM

## 2020-04-29 NOTE — TOC Transition Note (Signed)
Transition of Care [facility]) - CM/SW Discharge Note   Patient Details  Name: Eric Murray MRN: [id number] Date of Birth: 1961-02-14  [facility] CM/SW Contact:  Natasha Bence, LCSW Phone Number: 04/29/2020, 3:02 PM   Clinical Narrative:    CSW contacted Pam with [facility] to notify them of change in patient's prescription for IV antibiotics. CSW confirmed with MD that OPAT would be entered and sent to pharmacy. Pam expressed that she would be able to view order once entered. CSW also contacted patient's [place] to notify them of patient's discharge. [facility] agreeable to resume services. TOC signing off.    Final next level of care: [place] Barriers to Discharge: Barriers Resolved   Patient Goals and CMS Choice     Choice offered to / list presented to : Patient  Discharge Placement                Patient to be transferred to facility by: N/A Name of family member notified: N/A Patient and family notified of of transfer: 04/29/20  Discharge Plan and Services                          HH Arranged: RN [facility] Agency: [place], [place] Date [place]: 04/29/20 Time [place]: 5631 Representative spoke with at [place]: Pam-Ametisis    Kem Kays  Social Determinants of Health (SDOH) Interventions     Readmission Risk Interventions Readmission Risk Prevention Plan 04/06/2020  Post Dischage Appt Complete  Medication Screening Complete  Transportation Screening Complete  Some recent data might be hidden

## 2020-04-29 NOTE — Progress Notes (Signed)
Nsg Discharge Note  Admit Date:  04/26/2020 Discharge date: 04/29/2020   Eric Murray to be D/C'd Home per MD order.  AVS completed.  Copy for chart, and copy for patient signed, and dated. Patient/caregiver able to verbalize understanding. PICC line in place for home infusion. Discharge papers given and reviewed with patient and patients wife.  Discharge Medication: Allergies as of 04/29/2020      Reactions   Vancomycin    Red Man Syndrome       Medication List    STOP taking these medications   amLODipine 5 MG tablet Commonly known as: NORVASC   BC FAST PAIN RELIEF PO   cefTRIAXone  IVPB Commonly known as: ROCEPHIN   irbesartan 150 MG tablet Commonly known as: AVAPRO   methotrexate 2.5 MG tablet Commonly known as: RHEUMATREX   vancomycin  IVPB     TAKE these medications   daptomycin  IVPB Commonly known as: CUBICIN Inject 470 mg into the vein daily for 4 days. Indication:  Septic arthritis/bursitis left knee First Dose: No Last Day of Therapy:  05/03/2020 Labs - Once weekly:  CBC/D, BMP, and CPK Labs - Every other week:  ESR and CRP Method of administration: IV Push Method of administration may be changed at the discretion of home infusion pharmacist based upon assessment of the patient and/or caregiver's ability to self-administer the medication ordered.   famotidine 20 MG tablet Commonly known as: PEPCID Take 1 tablet (20 mg total) by mouth 2 (two) times daily.   Humira 40 MG/0.4ML Pskt Generic drug: Adalimumab Inject 40 mg into the skin every 14 (fourteen) days. Continue to hold until follow up with rheumatology service What changed: additional instructions   hydrOXYzine 25 MG tablet Commonly known as: ATARAX/VISTARIL Take 1 tablet (25 mg total) by mouth every 8 (eight) hours as needed for itching.   oxyCODONE-acetaminophen 5-325 MG tablet Commonly known as: PERCOCET/ROXICET Take 1 tablet by mouth every 4 (four) hours as needed.   pravastatin 40  MG tablet Commonly known as: PRAVACHOL Take 40 mg by mouth daily.   predniSONE 20 MG tablet Commonly known as: Deltasone Take 3 tablets (60 mg total) by mouth daily with breakfast for 5 doses. Start taking on: April 30, 2020            Discharge Care Instructions  (From admission, onward)         Start     Ordered   04/29/20 0000  Change dressing on IV access line weekly and PRN  (Home infusion instructions - [facility] )        04/29/20 1532          Discharge Assessment: Vitals:   04/29/20 0411 04/29/20 1424  BP: 124/90 119/72  Pulse: 68 72  Resp: 16 16  Temp: (!) 97.5 F (36.4 C) 97.6 F (36.4 C)  SpO2: 98% 100%   Skin clean, dry and intact without evidence of skin break down, no evidence of skin tears noted. IV catheter discontinued intact. Site without signs and symptoms of complications - no redness or edema noted at insertion site, patient denies c/o pain - only slight tenderness at site.  Dressing with slight pressure applied.  D/c Instructions-Education: Discharge instructions given to patient/family with verbalized understanding. D/c education completed with patient/family including follow up instructions, medication list, d/c activities limitations if indicated, with other d/c instructions as indicated by MD - patient able to verbalize understanding, all questions fully answered. Patient instructed to return to  ED, call 911, or call MD for any changes in condition.  Patient escorted via [place], and D/C home via private auto.  Zenaida Deed, RN 04/29/2020 5:09 PM

## 2020-04-30 DIAGNOSIS — M71162 Other infective bursitis, left knee: Secondary | ICD-10-CM | POA: Diagnosis not present

## 2020-04-30 DIAGNOSIS — M009 Pyogenic arthritis, unspecified: Secondary | ICD-10-CM | POA: Diagnosis not present

## 2020-04-30 DIAGNOSIS — M069 Rheumatoid arthritis, unspecified: Secondary | ICD-10-CM | POA: Diagnosis not present

## 2020-04-30 DIAGNOSIS — Z452 Encounter for adjustment and management of vascular access device: Secondary | ICD-10-CM | POA: Diagnosis not present

## 2020-04-30 DIAGNOSIS — I119 Hypertensive heart disease without heart failure: Secondary | ICD-10-CM | POA: Diagnosis not present

## 2020-04-30 DIAGNOSIS — Z9181 History of falling: Secondary | ICD-10-CM | POA: Diagnosis not present

## 2020-04-30 DIAGNOSIS — M7042 Prepatellar bursitis, left knee: Secondary | ICD-10-CM | POA: Diagnosis not present

## 2020-04-30 DIAGNOSIS — N178 Other acute kidney failure: Secondary | ICD-10-CM | POA: Diagnosis not present

## 2020-04-30 DIAGNOSIS — Z792 Long term (current) use of antibiotics: Secondary | ICD-10-CM | POA: Diagnosis not present

## 2020-04-30 LAB — ANCA TITERS
Atypical P-ANCA titer: <1:20 {titer}
C-ANCA: <1:20 {titer}
P-ANCA: <1:20 {titer}

## 2020-05-02 LAB — CULTURE, BLOOD (SINGLE)
Culture: NO GROWTH
Special Requests: ADEQUATE

## 2020-05-05 DIAGNOSIS — M7042 Prepatellar bursitis, left knee: Secondary | ICD-10-CM | POA: Diagnosis not present

## 2020-05-05 DIAGNOSIS — M069 Rheumatoid arthritis, unspecified: Secondary | ICD-10-CM | POA: Diagnosis not present

## 2020-05-05 DIAGNOSIS — Z9181 History of falling: Secondary | ICD-10-CM | POA: Diagnosis not present

## 2020-05-05 DIAGNOSIS — I119 Hypertensive heart disease without heart failure: Secondary | ICD-10-CM | POA: Diagnosis not present

## 2020-05-05 DIAGNOSIS — Z452 Encounter for adjustment and management of vascular access device: Secondary | ICD-10-CM | POA: Diagnosis not present

## 2020-05-05 DIAGNOSIS — Z792 Long term (current) use of antibiotics: Secondary | ICD-10-CM | POA: Diagnosis not present

## 2020-05-05 DIAGNOSIS — M009 Pyogenic arthritis, unspecified: Secondary | ICD-10-CM | POA: Diagnosis not present

## 2020-05-07 DIAGNOSIS — M71162 Other infective bursitis, left knee: Secondary | ICD-10-CM | POA: Diagnosis not present

## 2020-05-07 DIAGNOSIS — N179 Acute kidney failure, unspecified: Secondary | ICD-10-CM | POA: Diagnosis not present

## 2020-05-07 LAB — CYTOLOGY - NON PAP

## 2020-05-10 ENCOUNTER — Other Ambulatory Visit: Payer: Self-pay

## 2020-05-10 ENCOUNTER — Encounter: Payer: Self-pay | Admitting: Infectious Disease

## 2020-05-10 ENCOUNTER — Telehealth (INDEPENDENT_AMBULATORY_CARE_PROVIDER_SITE_OTHER): Payer: BC Managed Care – PPO | Admitting: Infectious Disease

## 2020-05-10 DIAGNOSIS — M00862 Arthritis due to other bacteria, left knee: Secondary | ICD-10-CM | POA: Diagnosis not present

## 2020-05-10 DIAGNOSIS — T368X5A Adverse effect of other systemic antibiotics, initial encounter: Secondary | ICD-10-CM | POA: Insufficient documentation

## 2020-05-10 DIAGNOSIS — N179 Acute kidney failure, unspecified: Secondary | ICD-10-CM

## 2020-05-10 DIAGNOSIS — B9689 Other specified bacterial agents as the cause of diseases classified elsewhere: Secondary | ICD-10-CM

## 2020-05-10 DIAGNOSIS — M069 Rheumatoid arthritis, unspecified: Secondary | ICD-10-CM

## 2020-05-10 DIAGNOSIS — M71162 Other infective bursitis, left knee: Secondary | ICD-10-CM

## 2020-05-10 HISTORY — DX: Adverse effect of other systemic antibiotics, initial encounter: T36.8X5A

## 2020-05-10 NOTE — Progress Notes (Signed)
Virtual Visit via Telephone Note  I connected with Eric Murray on 05/10/20 at 10:30 AM EDT by telephone and verified that I am speaking with the correct person using two identifiers.  Location: Patient: Home Provider: My Home   I discussed the limitations, risks, security and privacy concerns of performing an evaluation and management service by telephone and the availability of in person appointments. I also discussed with the patient that there may be a patient responsible charge related to this service. The patient expressed understanding and agreed to proceed.   History of Present Illness:  59 y.o. male with previous medical history of rheumatoid arthritis on Humira and methotrexate, hyperlipidemia, hypertension, and arthritis presenting for surgical intervention of left knee septic arthritis.  Eric Murray initially underwent meniscal surgery approximately 1 year ago and has had 2 cortisone injections with the most recent being about 6 to 8 weeks ago.  He began having worsening left knee pain and swelling over the past 4 weeks.  Seen by Dr. Sharol Given in the clinic having had previous synovial biopsy on 5/21 which showed inflammation.  Brought to the operating room  for left knee open synovectomy with synovial tissue sent for cultures and Hemovac drain placement.  Visualization showed extensive synovitis with cystic changes throughout the synovial lining which was extensively debrided.  Synovial fluid was sent for anaerobic/aerobic culture  Cultures did not yield an organism but we started him on vancomycin and ceftriaxone.  At home on IV antibiotics he developed ssx of red man's syndrome and had supra therapeutic vancomycin levels. He also became hypotensive --though not clear why and was admitted to [facility] and found to be in ARF. His abx were stopped and Dr Dyann Kief called me and we switched him to IV daptomycin which he has completed and PICC is out.  His knee is " 85%" back to  normal.  He is not on any meds for his RA which is currently quiescent.   Past Medical History:  Diagnosis Date  . Arthritis   . Benign essential HTN   . Colon polyps    TA 2009  . History of kidney stones   . Hyperlipidemia   . Rheumatoid arthritis ([place])   . Vancomycin adverse reaction 05/10/2020    Past Surgical History:  Procedure Laterality Date  . arm fracture surgery     as child   . COLONOSCOPY    . HERNIA REPAIR    . I & D EXTREMITY Left 04/04/2020   Procedure: LEFT KNEE OPEN DEBRIDEMENT;  Surgeon: Newt Minion, MD;  Location: [facility];  Service: Orthopedics;  Laterality: Left;  . MENISCUS REPAIR Left   . POLYPECTOMY    . SYNOVECTOMY Left 04/04/2020   LEFT KNEE OPEN synovectomy both anterior posterior and medial and lateral gutters    Family History  Problem Relation Age of Onset  . Cataracts Mother 21       Pt died of old age  . Pancreatic cancer Father 28  . Prostate cancer Father   . Breast cancer Sister   . Cushing syndrome Brother   . Colon cancer Neg Hx   . Colon polyps Neg Hx       Social History   Socioeconomic History  . Marital status: Legally Separated    Spouse name: Not on file  . Number of children: Not on file  . Years of education: Not on file  . Highest education level: Not on file  Occupational History  . Occupation: Clinical biochemist  Employer: [employer]  Tobacco Use  . Smoking status: Former Smoker    Packs/day: 1.00    Types: Cigarettes    Quit date: 04/04/2019    Years since quitting: 1.1  . Smokeless tobacco: Never Used  Vaping Use  . Vaping Use: Every day  Substance and Sexual Activity  . Alcohol use: Yes    Alcohol/week: 18.0 standard drinks    Types: 18 Cans of beer per week    Comment: mostly weekends  . Drug use: No  . Sexual activity: Not on file  Other Topics Concern  . Not on file  Social History Narrative   Pt lives with girlfriend   Social Determinants of Health   Financial Resource Strain:    . Difficulty of Paying Living Expenses: Not on file  Food Insecurity:   . Worried About Charity fundraiser in the Last Year: Not on file  . Ran Out of Food in the Last Year: Not on file  Transportation Needs:   . Lack of Transportation (Medical): Not on file  . Lack of Transportation (Non-Medical): Not on file  Physical Activity:   . Days of Exercise per Week: Not on file  . Minutes of Exercise per Session: Not on file  Stress:   . Feeling of Stress : Not on file  Social Connections:   . Frequency of Communication with Friends and Family: Not on file  . Frequency of Social Gatherings with Friends and Family: Not on file  . Attends Religious Services: Not on file  . Active Member of Clubs or Organizations: Not on file  . Attends Archivist Meetings: Not on file  . Marital Status: Not on file    Allergies  Allergen Reactions  . Vancomycin     Red Man Syndrome      Current Outpatient Medications:  .  Adalimumab (HUMIRA) 40 MG/0.4ML PSKT, Inject 40 mg into the skin every 14 (fourteen) days. Continue to hold until follow up with rheumatology service, Disp: 2 each, Rfl:  .  famotidine (PEPCID) 20 MG tablet, Take 1 tablet (20 mg total) by mouth 2 (two) times daily., Disp: 40 tablet, Rfl: 0 .  hydrOXYzine (ATARAX/VISTARIL) 25 MG tablet, Take 1 tablet (25 mg total) by mouth every 8 (eight) hours as needed for itching., Disp: 30 tablet, Rfl: 0 .  oxyCODONE-acetaminophen (PERCOCET/ROXICET) 5-325 MG tablet, Take 1 tablet by mouth every 4 (four) hours as needed., Disp: 30 tablet, Rfl: 0 .  pravastatin (PRAVACHOL) 40 MG tablet, Take 40 mg by mouth daily. , Disp: , Rfl: 12    Observations/Objective:  He sounded on phone to be doing well just having finished antibiotics  Assessment and Plan:  Septic knee: he is going to followup in Dr Jess Barters office and he will followup with [place] in late September  ARF: seems to be resolving. Cause likely combination of hypotension and high  vancomycin levels. I doubt AIN    Follow Up Instructions:    I discussed the assessment and treatment plan with the patient. The patient was provided an opportunity to ask questions and all were answered. The patient agreed with the plan and demonstrated an understanding of the instructions.   The patient was advised to call back or seek an in-person evaluation if the symptoms worsen or if the condition fails to improve as anticipated.  I provided 21 minutes of non-face-to-face time during this encounter.   Alcide Evener, MD

## 2020-05-15 ENCOUNTER — Ambulatory Visit (INDEPENDENT_AMBULATORY_CARE_PROVIDER_SITE_OTHER): Payer: BC Managed Care – PPO | Admitting: Physician Assistant

## 2020-05-15 ENCOUNTER — Encounter: Payer: Self-pay | Admitting: Physician Assistant

## 2020-05-15 DIAGNOSIS — M25562 Pain in left knee: Secondary | ICD-10-CM

## 2020-05-15 DIAGNOSIS — G8929 Other chronic pain: Secondary | ICD-10-CM

## 2020-05-15 NOTE — Progress Notes (Signed)
Office Visit Note   Patient: Eric Murray           Date of Birth: [birth year]           MRN: [id number] Visit Date: 05/15/2020              Requested by: Leanna Battles, [place],  [place] [address] PCP: Leanna Battles, MD  Chief Complaint  Patient presents with  . Left Knee - Routine Post Op      HPI: Is 6 weeks status post irrigation and debridement of his left knee.  He is doing quite well.  He would like to do some outpatient physical therapy on strengthening if possible.  Assessment & Plan: Visit Diagnoses: No diagnosis found.  Plan: Patient will return in 1 month.  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  We will send in a outpatient order for physical therapy  Follow-Up Instructions: No follow-ups on file.   Ortho Exam  Patient is alert, oriented, no adenopathy, well-dressed, normal affect, normal respiratory effort. Left knee: Well-healed surgical incision.  No effusion mild soft tissue swelling.  He does have some noted quad atrophy.  Range of motion is nonpainful.  No cellulitis no fluctuance in the knee compartments are soft and nontender  Imaging: No results found. No images are attached to the encounter.  Labs: Lab Results  Component Value Date   ESRSEDRATE 25 (H) 04/26/2020   ESRSEDRATE 68 (H) 04/05/2020   CRP 4.2 (H) 04/26/2020   CRP 4.1 (H) 04/05/2020   REPTSTATUS 05/02/2020 FINAL 04/26/2020   GRAMSTAIN  04/04/2020    ABUNDANT WBC PRESENT,BOTH PMN AND MONONUCLEAR NO ORGANISMS SEEN    CULT  04/26/2020    NO GROWTH 6 DAYS Performed at [facility], [address] [place], [place] [address]      Lab Results  Component Value Date   ALBUMIN 2.6 (L) 04/29/2020   ALBUMIN 2.4 (L) 04/28/2020   ALBUMIN 2.3 (L) 04/27/2020    Lab Results  Component Value Date   MG 1.9 04/27/2020   No results found for: VD25OH  No results found for: PREALBUMIN CBC EXTENDED Latest Ref Rng & Units  04/26/2020 04/04/2020 04/06/2012  WBC 4.0 - 10.5 K/uL 6.0 7.8 -  RBC 4.22 - 5.81 MIL/uL 3.63(L) 4.25 -  HGB 13.0 - 17.0 g/dL 9.8(L) 11.8(L) 17.0  HCT 39 - 52 % 31.1(L) 38.1(L) 50.0  PLT 150 - 400 K/uL 302 459(H) -  NEUTROABS 1.7 - 7.7 K/uL 3.7 - -  LYMPHSABS 0.7 - 4.0 K/uL 0.9 - -     There is no height or weight on file to calculate BMI.  Orders:  No orders of the defined types were placed in this encounter.  No orders of the defined types were placed in this encounter.    Procedures: No procedures performed  Clinical Data: No additional findings.  ROS:  All other systems negative, except as noted in the HPI. Review of Systems  Objective: Vital Signs: There were no vitals taken for this visit.  Specialty Comments:  No specialty comments available.  PMFS History: Patient Active Problem List   Diagnosis Date Noted  . Vancomycin adverse reaction 05/10/2020  . Acute renal failure (ARF) ([place]) 04/26/2020  . HLD (hyperlipidemia) 04/26/2020  . HTN (hypertension) 04/26/2020  . RA (rheumatoid arthritis) ([place]) 04/26/2020  . Septic infrapatellar bursitis of left knee 04/04/2020  . Pyogenic arthritis of left knee  joint ([place])   . RECTAL BLEEDING 08/15/2008   Past Medical History:  Diagnosis Date  . Arthritis   . Benign essential HTN   . Colon polyps    TA 2009  . History of kidney stones   . Hyperlipidemia   . Rheumatoid arthritis ([place])   . Vancomycin adverse reaction 05/10/2020    Family History  Problem Relation Age of Onset  . Cataracts Mother 30       Pt died of old age  . Pancreatic cancer Father 37  . Prostate cancer Father   . Breast cancer Sister   . Cushing syndrome Brother   . Colon cancer Neg Hx   . Colon polyps Neg Hx     Past Surgical History:  Procedure Laterality Date  . arm fracture surgery     as child   . COLONOSCOPY    . HERNIA REPAIR    . I & D EXTREMITY Left 04/04/2020   Procedure: LEFT KNEE OPEN DEBRIDEMENT;  Surgeon: Newt Minion, MD;   Location: [facility];  Service: Orthopedics;  Laterality: Left;  . MENISCUS REPAIR Left   . POLYPECTOMY    . SYNOVECTOMY Left 04/04/2020   LEFT KNEE OPEN synovectomy both anterior posterior and medial and lateral gutters   Social History   Occupational History  . Occupation: ELECTRICIAN    Employer: [employer]  Tobacco Use  . Smoking status: Former Smoker    Packs/day: 1.00    Types: Cigarettes    Quit date: 04/04/2019    Years since quitting: 1.1  . Smokeless tobacco: Never Used  Vaping Use  . Vaping Use: Every day  Substance and Sexual Activity  . Alcohol use: Yes    Alcohol/week: 18.0 standard drinks    Types: 18 Cans of beer per week    Comment: mostly weekends  . Drug use: No  . Sexual activity: Not on file

## 2020-05-18 DIAGNOSIS — N179 Acute kidney failure, unspecified: Secondary | ICD-10-CM | POA: Diagnosis not present

## 2020-05-18 DIAGNOSIS — I129 Hypertensive chronic kidney disease with stage 1 through stage 4 chronic kidney disease, or unspecified chronic kidney disease: Secondary | ICD-10-CM | POA: Diagnosis not present

## 2020-05-18 DIAGNOSIS — N189 Chronic kidney disease, unspecified: Secondary | ICD-10-CM | POA: Diagnosis not present

## 2020-05-18 DIAGNOSIS — D631 Anemia in chronic kidney disease: Secondary | ICD-10-CM | POA: Diagnosis not present

## 2020-05-29 ENCOUNTER — Ambulatory Visit (INDEPENDENT_AMBULATORY_CARE_PROVIDER_SITE_OTHER): Payer: BC Managed Care – PPO | Admitting: Infectious Disease

## 2020-05-29 ENCOUNTER — Encounter: Payer: Self-pay | Admitting: Infectious Disease

## 2020-05-29 ENCOUNTER — Other Ambulatory Visit: Payer: Self-pay

## 2020-05-29 VITALS — BP 137/90 | HR 90 | Temp 98.0°F | Ht 71.0 in | Wt 172.0 lb

## 2020-05-29 DIAGNOSIS — T368X5A Adverse effect of other systemic antibiotics, initial encounter: Secondary | ICD-10-CM

## 2020-05-29 DIAGNOSIS — I10 Essential (primary) hypertension: Secondary | ICD-10-CM

## 2020-05-29 DIAGNOSIS — M71162 Other infective bursitis, left knee: Secondary | ICD-10-CM | POA: Diagnosis not present

## 2020-05-29 DIAGNOSIS — M00862 Arthritis due to other bacteria, left knee: Secondary | ICD-10-CM

## 2020-05-29 DIAGNOSIS — M069 Rheumatoid arthritis, unspecified: Secondary | ICD-10-CM

## 2020-05-29 DIAGNOSIS — N179 Acute kidney failure, unspecified: Secondary | ICD-10-CM

## 2020-05-29 DIAGNOSIS — Z7189 Other specified counseling: Secondary | ICD-10-CM

## 2020-05-29 DIAGNOSIS — Z7185 Encounter for immunization safety counseling: Secondary | ICD-10-CM

## 2020-05-29 DIAGNOSIS — B9689 Other specified bacterial agents as the cause of diseases classified elsewhere: Secondary | ICD-10-CM

## 2020-05-29 NOTE — Progress Notes (Signed)
Subjective:  Chief complaint concerned about his left knee that is still swollen and has some warmth to it  Patient ID: Eric Murray, male    DOB: [birth year], 59 y.o.   MRN: [id number]  HPI  59 y.o.malewith previous medical history of rheumatoid arthritis on Humira and methotrexate, hyperlipidemia, hypertension, and arthritis presenting for surgical intervention of left knee septic arthritis.  Mr. Schuetzeinitially underwent meniscal surgery approximately 1 year ago and has had 2 cortisone injections with the most recent being about 58 to 58 weeks ago. He began having worsening left knee pain and swelling over the past 4 weeks. Seen by Eric Murray in the clinic having had previous synovial biopsy on 5/21 which showed inflammation.  Brought to the operating room  for left knee open synovectomy with synovial tissue sent for cultures and Hemovac drain placement. Visualization showed extensive synovitis with cystic changes throughout the synovial lining which was extensively debrided. Synovial fluid was sent for anaerobic/aerobic culture  Cultures did not yield an organism but we started him on vancomycin and ceftriaxone.  At home on IV antibiotics he developed ssx of red man's syndrome and had supra therapeutic vancomycin levels. He also became hypotensive --though not clear why and was admitted to [facility] and found to be in ARF. His abx were stopped and Eric Murray called me and we switched him to IV daptomycin which he has completed   Conducted a phone visit with him on 2 September and he told me that his knee was "85% back to normal.  His RA has not flared either.  He is concerned that there is still warmth over the left knee and there is still some swelling as well though less so than prior to his surgery.  He is being followed closely by Eric Murray and his PA Eric Murray.   He has been vaccinated against Covid with Eric Murray x2 vaccines as was his girlfriend.  He tells me story of her  succumbing to this infection about a month ago when her daughter who is unvaccinated had a 3-week course of symptoms due to COVID-19.  Eric Murray himself did not succumbed to infection at that time.    Past Medical History:  Diagnosis Date  . Arthritis   . Benign essential HTN   . Colon polyps    TA 2009  . History of kidney stones   . Hyperlipidemia   . Rheumatoid arthritis ([place])   . Vancomycin adverse reaction 05/10/2020    Past Surgical History:  Procedure Laterality Date  . arm fracture surgery     as child   . COLONOSCOPY    . HERNIA REPAIR    . I & D EXTREMITY Left 04/04/2020   Procedure: LEFT KNEE OPEN DEBRIDEMENT;  Surgeon: Eric Minion, MD;  Location: [facility];  Service: Orthopedics;  Laterality: Left;  . MENISCUS REPAIR Left   . POLYPECTOMY    . SYNOVECTOMY Left 04/04/2020   LEFT KNEE OPEN synovectomy both anterior posterior and medial and lateral gutters    Family History  Problem Relation Age of Onset  . Cataracts Mother 69       Pt died of old age  . Pancreatic cancer Father 54  . Prostate cancer Father   . Breast cancer Sister   . Cushing syndrome Brother   . Colon cancer Neg Hx   . Colon polyps Neg Hx       Social History   Socioeconomic History  . Marital status: Legally Separated  Spouse name: Not on file  . Number of children: Not on file  . Years of education: Not on file  . Highest education level: Not on file  Occupational History  . Occupation: ELECTRICIAN    Employer: [employer]  Tobacco Use  . Smoking status: Former Smoker    Packs/day: 1.00    Types: Cigarettes    Quit date: 04/04/2019    Years since quitting: 1.1  . Smokeless tobacco: Never Used  Vaping Use  . Vaping Use: Every day  Substance and Sexual Activity  . Alcohol use: Yes    Alcohol/week: 18.0 standard drinks    Types: 18 Cans of beer per week    Comment: mostly weekends  . Drug use: No  . Sexual activity: Not on file  Other Topics Concern  . Not on  file  Social History Narrative   Pt lives with girlfriend   Social Determinants of Health   Financial Resource Strain:   . Difficulty of Paying Living Expenses: Not on file  Food Insecurity:   . Worried About Charity fundraiser in the Last Year: Not on file  . Ran Out of Food in the Last Year: Not on file  Transportation Needs:   . Lack of Transportation (Medical): Not on file  . Lack of Transportation (Non-Medical): Not on file  Physical Activity:   . Days of Exercise per Week: Not on file  . Minutes of Exercise per Session: Not on file  Stress:   . Feeling of Stress : Not on file  Social Connections:   . Frequency of Communication with Friends and Family: Not on file  . Frequency of Social Gatherings with Friends and Family: Not on file  . Attends Religious Services: Not on file  . Active Member of Clubs or Organizations: Not on file  . Attends Archivist Meetings: Not on file  . Marital Status: Not on file    Allergies  Allergen Reactions  . Vancomycin     Red Man Syndrome      Current Outpatient Medications:  .  amLODipine (NORVASC) 5 MG tablet, Take 5 mg by mouth daily., Disp: , Rfl:  .  pravastatin (PRAVACHOL) 40 MG tablet, Take 40 mg by mouth daily. , Disp: , Rfl: 12 .  Adalimumab (HUMIRA) 40 MG/0.4ML PSKT, Inject 40 mg into the skin every 14 (fourteen) days. Continue to hold until follow up with rheumatology service (Patient not taking: Reported on 05/29/2020), Disp: 2 each, Rfl:  .  famotidine (PEPCID) 20 MG tablet, Take 1 tablet (20 mg total) by mouth 2 (two) times daily. (Patient not taking: Reported on 05/29/2020), Disp: 40 tablet, Rfl: 0 .  hydrOXYzine (ATARAX/VISTARIL) 25 MG tablet, Take 1 tablet (25 mg total) by mouth every 8 (eight) hours as needed for itching. (Patient not taking: Reported on 05/29/2020), Disp: 30 tablet, Rfl: 0 .  irbesartan (AVAPRO) 150 MG tablet, Take 150 mg by mouth daily. (Patient not taking: Reported on 05/29/2020), Disp: , Rfl:    .  methotrexate 2.5 MG tablet, Take 2.5 mg by mouth daily. (Patient not taking: Reported on 05/29/2020), Disp: , Rfl:  .  oxyCODONE-acetaminophen (PERCOCET/ROXICET) 5-325 MG tablet, Take 1 tablet by mouth every 4 (four) hours as needed. (Patient not taking: Reported on 05/29/2020), Disp: 30 tablet, Rfl: 0    Review of Systems  Constitutional: Negative for activity change, appetite change, chills, diaphoresis, fatigue, fever and unexpected weight change.  HENT: Negative for congestion, rhinorrhea, sinus  pressure, sneezing, sore throat and trouble swallowing.   Eyes: Negative for photophobia and visual disturbance.  Respiratory: Negative for cough, chest tightness, shortness of breath, wheezing and stridor.   Cardiovascular: Negative for chest pain, palpitations and leg swelling.  Gastrointestinal: Negative for abdominal distention, abdominal pain, anal bleeding, blood in stool, constipation, diarrhea, nausea and vomiting.  Genitourinary: Negative for difficulty urinating, dysuria, flank pain and hematuria.  Musculoskeletal: Positive for joint swelling. Negative for arthralgias, back pain, gait problem and myalgias.  Skin: Negative for color change, pallor, rash and wound.  Neurological: Negative for dizziness, tremors, weakness and light-headedness.  Hematological: Negative for adenopathy. Does not bruise/bleed easily.  Psychiatric/Behavioral: Negative for agitation, behavioral problems, confusion, decreased concentration, dysphoric mood and sleep disturbance.       Objective:   Physical Exam Constitutional:      General: He is not in acute distress.    Appearance: Normal appearance. He is well-developed. He is not ill-appearing or diaphoretic.  HENT:     Head: Normocephalic and atraumatic.     Right Ear: Hearing and external ear normal.     Left Ear: Hearing and external ear normal.     Nose: No nasal deformity or rhinorrhea.  Eyes:     General: No scleral icterus.     Conjunctiva/sclera: Conjunctivae normal.     Right eye: Right conjunctiva is not injected.     Left eye: Left conjunctiva is not injected.  Neck:     Vascular: No JVD.  Cardiovascular:     Rate and Rhythm: Normal rate and regular rhythm.     Heart sounds: S1 normal and S2 normal. No murmur heard.   Pulmonary:     Effort: Pulmonary effort is normal. No respiratory distress.  Abdominal:     General: There is no distension.     Palpations: Abdomen is soft.  Musculoskeletal:        General: Normal range of motion.     Right shoulder: Normal.     Left shoulder: Normal.     Cervical back: Normal range of motion and neck supple.     Right hip: Normal.     Left hip: Normal.     Right knee: Normal.     Left knee: Swelling and effusion present.  Lymphadenopathy:     Head:     Right side of head: No submandibular, preauricular or posterior auricular adenopathy.     Left side of head: No submandibular, preauricular or posterior auricular adenopathy.     Cervical: No cervical adenopathy.     Right cervical: No superficial or deep cervical adenopathy.    Left cervical: No superficial or deep cervical adenopathy.  Skin:    General: Skin is warm and dry.     Coloration: Skin is not pale.     Findings: No abrasion, bruising, ecchymosis, erythema, lesion or rash.     Nails: There is no clubbing.  Neurological:     General: No focal deficit present.     Mental Status: He is alert and oriented to person, place, and time.     Sensory: No sensory deficit.     Coordination: Coordination normal.     Gait: Gait normal.  Psychiatric:        Attention and Perception: He is attentive.        Mood and Affect: Mood normal.        Speech: Speech normal.        Behavior: Behavior normal. Behavior is cooperative.  Thought Content: Thought content normal.        Judgment: Judgment normal.    Left knee with some warmth incision is well-healed  May 29, 2020:         Assessment &  Plan:   Septic knee: Cultures were negative he had a rocky course but completed antibiotics.  We will check inflammatory markers today CBC and BMP he is going to follow-up with Eric Murray he can follow-up with me as needed providers inflammatory markers are reassuring.  RA: Could confound interpretation of inflammatory markers though he says his RA is not very active at all at present.  I would avoid Biologics till you are certain that he is completely cured of this infection.   Acute renal failure: Likely due to combination of hypotension and vancomycin toxicity.  Seems to be resolving we will check labs today.  Covid 19 prevention he has had 2 doses of [place]

## 2020-05-30 ENCOUNTER — Encounter: Payer: Self-pay | Admitting: Physical Therapy

## 2020-05-30 ENCOUNTER — Ambulatory Visit: Payer: BC Managed Care – PPO | Admitting: Physical Therapy

## 2020-05-30 DIAGNOSIS — R600 Localized edema: Secondary | ICD-10-CM | POA: Diagnosis not present

## 2020-05-30 DIAGNOSIS — M6281 Muscle weakness (generalized): Secondary | ICD-10-CM

## 2020-05-30 DIAGNOSIS — M25662 Stiffness of left knee, not elsewhere classified: Secondary | ICD-10-CM

## 2020-05-30 DIAGNOSIS — R2689 Other abnormalities of gait and mobility: Secondary | ICD-10-CM

## 2020-05-30 LAB — CBC WITH DIFFERENTIAL/PLATELET
Absolute Monocytes: 662 cells/uL (ref 200–950)
Basophils Absolute: 69 cells/uL (ref 0–200)
Basophils Relative: 1.4 %
Eosinophils Absolute: 412 cells/uL (ref 15–500)
Eosinophils Relative: 8.4 %
HCT: 37.4 % — ABNORMAL LOW (ref 38.5–50.0)
Hemoglobin: 12 g/dL — ABNORMAL LOW (ref 13.2–17.1)
Lymphs Abs: 1950 cells/uL (ref 850–3900)
MCH: 27.4 pg (ref 27.0–33.0)
MCHC: 32.1 g/dL (ref 32.0–36.0)
MCV: 85.4 fL (ref 80.0–100.0)
MPV: 9.7 fL (ref 7.5–12.5)
Monocytes Relative: 13.5 %
Neutro Abs: 1808 cells/uL (ref 1500–7800)
Neutrophils Relative %: 36.9 %
Platelets: 331 10*3/uL (ref 140–400)
RBC: 4.38 10*6/uL (ref 4.20–5.80)
RDW: 15.9 % — ABNORMAL HIGH (ref 11.0–15.0)
Total Lymphocyte: 39.8 %
WBC: 4.9 10*3/uL (ref 3.8–10.8)

## 2020-05-30 LAB — BASIC METABOLIC PANEL WITH GFR
BUN: 12 mg/dL (ref 7–25)
CO2: 27 mmol/L (ref 20–32)
Calcium: 9.8 mg/dL (ref 8.6–10.3)
Chloride: 106 mmol/L (ref 98–110)
Creat: 1.02 mg/dL (ref 0.70–1.33)
GFR, Est African American: 94 mL/min/{1.73_m2} (ref >=60)
GFR, Est Non African American: 81 mL/min/{1.73_m2} (ref >=60)
Glucose, Bld: 98 mg/dL (ref 65–99)
Potassium: 4.8 mmol/L (ref 3.5–5.3)
Sodium: 141 mmol/L (ref 135–146)

## 2020-05-30 LAB — SEDIMENTATION RATE: Sed Rate: 9 mm/h (ref 0–20)

## 2020-05-30 LAB — C-REACTIVE PROTEIN: CRP: 2.2 mg/L (ref <8.0)

## 2020-05-30 NOTE — Therapy (Signed)
[facility] Physical Therapy [address] [place], [place], [address] Phone: [phone]   Fax:  [phone]  Physical Therapy Treatment  Patient Details  Name: Eric Murray MRN: [id number] Date of Birth: [birth year] Referring Provider (PT): Persons, Bevely Palmer, [place]   Encounter Date: 05/30/2020   PT End of Session - 05/30/20 1022    Visit Number 1    Number of Visits 6    Date for PT Re-Evaluation 07/11/20    PT Start Time 0930    PT Stop Time 1012    PT Time Calculation (min) 42 min    Behavior During Therapy [facility] for tasks assessed/performed           Past Medical History:  Diagnosis Date  . Arthritis   . Benign essential HTN   . Colon polyps    TA 2009  . History of kidney stones   . Hyperlipidemia   . Rheumatoid arthritis ([place])   . Vancomycin adverse reaction 05/10/2020    Past Surgical History:  Procedure Laterality Date  . arm fracture surgery     as child   . COLONOSCOPY    . HERNIA REPAIR    . I & D EXTREMITY Left 04/04/2020   Procedure: LEFT KNEE OPEN DEBRIDEMENT;  Surgeon: Newt Minion, MD;  Location: [place];  Service: Orthopedics;  Laterality: Left;  . MENISCUS REPAIR Left   . POLYPECTOMY    . SYNOVECTOMY Left 04/04/2020   LEFT KNEE OPEN synovectomy both anterior posterior and medial and lateral gutters    There were no vitals filed for this visit.   Subjective Assessment - 05/30/20 0933    Subjective Pt is a 59 y/o male who presents to OPPT s/p I&D due to Lt knee infection on 04/04/20.  Pt also had Lt knee scope in 2020, and unsure if infection related to surgery.  Pt presents today with some residual swelling, decreased mobility, and decreased strength.    Limitations Standing;Walking    Patient Stated Goals return to 100% - be able to kneel on knee    Currently in Pain? No/denies    Pain Score 0-No pain              [facility] PT Assessment - 05/30/20 0937      Assessment   Medical Diagnosis M25.562,G89.29 (ICD-10-CM) -  Chronic pain of left knee    Referring Provider (PT) Persons, Bevely Palmer, [place]    Onset Date/Surgical Date 04/04/20    Hand Dominance Right    Next MD Visit 06/12/20    Prior Therapy [facility]      Precautions   Precautions None      Restrictions   Weight Bearing Restrictions No      Balance Screen   Has the patient fallen in the past 6 months No    Has the patient had a decrease in activity level because of a fear of falling?  No    Is the patient reluctant to leave their home because of a fear of falling?  No      Home Social worker Private residence    Living Arrangements Spouse/significant other;Children   fiance, step daughter   Type of [place] to enter    Entrance Stairs-Number of Steps 3    Entrance Stairs-Rails Right;Left;Can reach both    [place] One level    Additional Comments some difficulty with descending stairs      Prior  Function   Level of Independence Independent    Vocation On disability;Full time employment    Haematologist - lifting, kneeling, walking    Leisure ride motorcycle, housework, no regular exercise      Cognition   Overall Cognitive Status Within Functional Limits for tasks assessed      ROM / Strength   AROM / PROM / Strength AROM;Strength      AROM   AROM Assessment Site Knee    Right/Left Knee Right;Left    Right Knee Extension 2    Right Knee Flexion 139    Left Knee Extension 0    Left Knee Flexion 128      Strength   Strength Assessment Site Knee    Right/Left Knee Right;Left    Right Knee Flexion 5/5    Right Knee Extension 5/5    Left Knee Flexion 3+/5    Left Knee Extension 3+/5      Palpation   Patella mobility WNL      Ambulation/Gait   Gait Comments slight antalgic gait; otherwise  no significant abnormalities                         [facility] Adult PT Treatment/Exercise - 05/30/20 0937      Exercises   Exercises Knee/Hip      Knee/Hip  Exercises: Supine   Quad Sets Left;5 reps    Quad Sets Limitations cues for quad activation    Heel Slides Left;5 reps;AAROM    Heel Slides Limitations with strap for end range    Straight Leg Raises Right;5 reps    Straight Leg Raises Limitations cues to decrease extensor lag                  PT Education - 05/30/20 1022    Education Details HEP    Person(s) Educated Patient    Methods Explanation;Demonstration;Handout    Comprehension Verbalized understanding;Returned demonstration;Need further instruction               PT Long Term Goals - 05/30/20 1026      PT LONG TERM GOAL #1   Title independent with HEP    Status New    Target Date 07/11/20      PT LONG TERM GOAL #2   Title improve Lt knee AROM 0-135 for improved function    Status New    Target Date 07/11/20      PT LONG TERM GOAL #3   Title demonstrate improved Lt knee strength to 5/5 for improved function and better ease of negotiating stairs    Status New    Target Date 07/11/20      PT LONG TERM GOAL #4   Title amb without gait deviation for improved function    Status New    Target Date 07/11/20                 Plan - 05/30/20 0954    Clinical Impression Statement Pt is a 59 y/o male who presents to OPPT s/p Lt knee I&D, with prior Lt knee scope in 2020.  Pt demonstrates mild ROM and strength deficits as well as increased swelling and decreased mobility affecting functional mobility.  Pt will benefit from PT to address deficits listed.    Personal Factors and Comorbidities Comorbidity 2;Past/Current Experience    Comorbidities RA, HTN    Examination-Activity Limitations Squat;Stairs;Stand;Locomotion Level;Transfers    Examination-Participation Restrictions Community Activity;Occupation  Stability/Clinical Decision Making Stable/Uncomplicated    Clinical Decision Making Low    Rehab Potential Good    PT Frequency 1x / week    PT Duration 6 weeks    PT Treatment/Interventions  ADLs/Self Care Home Management;Cryotherapy;Electrical Stimulation;Moist Heat;Gait training;Stair training;Functional mobility training;Therapeutic activities;Therapeutic exercise;Balance training;Neuromuscular re-education;Patient/family education;Manual techniques;Vasopneumatic Device;Taping;Passive range of motion;Dry needling    PT Next Visit Plan review HEP, if good quad activation progress HEP to more aggressive strengthening, vaso for swelling PRN    PT Home Exercise Plan Access Code: [email or web address]    Consulted and Agree with Plan of Care Patient           Patient will benefit from skilled therapeutic intervention in order to improve the following deficits and impairments:  Abnormal gait, Increased edema, Pain, Difficulty walking, Decreased mobility, Decreased range of motion, Impaired flexibility, Decreased strength  Visit Diagnosis: Stiffness of left knee, not elsewhere classified  Other abnormalities of gait and mobility  Muscle weakness (generalized)  Localized edema     Problem List Patient Active Problem List   Diagnosis Date Noted  . Vancomycin adverse reaction 05/10/2020  . Acute renal failure (ARF) ([place]) 04/26/2020  . HLD (hyperlipidemia) 04/26/2020  . HTN (hypertension) 04/26/2020  . RA (rheumatoid arthritis) ([place]) 04/26/2020  . Septic infrapatellar bursitis of left knee 04/04/2020  . Pyogenic arthritis of left knee joint ([place])   . RECTAL BLEEDING 08/15/2008      Laureen Abrahams, PT, DPT 05/30/20 10:29 AM     [facility] Therapy [address] [place], [place], [address] Phone: [phone]   Fax:  [phone]  Name: COREY LASKI MRN: [id number] Date of Birth: 1961-01-17

## 2020-05-30 NOTE — Addendum Note (Signed)
Addended by: Faustino Congress F on: 05/30/2020 11:01 AM   Modules accepted: Orders

## 2020-05-30 NOTE — Patient Instructions (Signed)
Access Code: [email or web address] URL: [email or web address] Date: 05/30/2020 Prepared by: Faustino Congress  Exercises Supine Quad Set - 3 x daily - 7 x weekly - 1 sets - 10 reps - 5 sec hold Supine Straight Leg Raises - 3 x daily - 7 x weekly - 1 sets - 10 reps - 2-3sec hold Supine Heel Slide with Strap - 3 x daily - 7 x weekly - 1 sets - 10 reps

## 2020-06-04 DIAGNOSIS — N179 Acute kidney failure, unspecified: Secondary | ICD-10-CM | POA: Diagnosis not present

## 2020-06-04 DIAGNOSIS — I10 Essential (primary) hypertension: Secondary | ICD-10-CM | POA: Diagnosis not present

## 2020-06-06 ENCOUNTER — Encounter: Payer: BC Managed Care – PPO | Admitting: Rehabilitative and Restorative Service Providers"

## 2020-06-11 ENCOUNTER — Telehealth: Payer: Self-pay | Admitting: *Deleted

## 2020-06-11 NOTE — Telephone Encounter (Signed)
-----   Message from Truman Hayward, MD sent at 05/29/2020  8:02 PM EDT ----- Not all labs back but can we let him know his kidney fxn has returned to [facility]

## 2020-06-11 NOTE — Telephone Encounter (Signed)
Relayed results to patient. He was pleased. No additional questions. Landis Gandy, RN

## 2020-06-12 ENCOUNTER — Encounter: Payer: Self-pay | Admitting: Physical Therapy

## 2020-06-12 ENCOUNTER — Ambulatory Visit (INDEPENDENT_AMBULATORY_CARE_PROVIDER_SITE_OTHER): Payer: BC Managed Care – PPO | Admitting: Orthopedic Surgery

## 2020-06-12 ENCOUNTER — Encounter: Payer: Self-pay | Admitting: Orthopedic Surgery

## 2020-06-12 ENCOUNTER — Ambulatory Visit: Payer: BC Managed Care – PPO | Admitting: Physical Therapy

## 2020-06-12 ENCOUNTER — Other Ambulatory Visit: Payer: Self-pay

## 2020-06-12 VITALS — Ht 71.0 in | Wt 172.0 lb

## 2020-06-12 DIAGNOSIS — R600 Localized edema: Secondary | ICD-10-CM | POA: Diagnosis not present

## 2020-06-12 DIAGNOSIS — R2689 Other abnormalities of gait and mobility: Secondary | ICD-10-CM

## 2020-06-12 DIAGNOSIS — M009 Pyogenic arthritis, unspecified: Secondary | ICD-10-CM

## 2020-06-12 DIAGNOSIS — M25662 Stiffness of left knee, not elsewhere classified: Secondary | ICD-10-CM | POA: Diagnosis not present

## 2020-06-12 DIAGNOSIS — M6281 Muscle weakness (generalized): Secondary | ICD-10-CM

## 2020-06-12 NOTE — Therapy (Signed)
[facility] Physical Therapy [address] [place], [place], [address] Phone: [phone]   Fax:  [phone]  Physical Therapy Treatment  Patient Details  Name: Eric Murray MRN: [id number] Date of Birth: [birth year] Referring Provider (PT): Persons, Bevely Palmer, [place]   Encounter Date: 06/12/2020   PT End of Session - 06/12/20 1134    Visit Number 2    Number of Visits 6    Date for PT Re-Evaluation 07/11/20    PT Start Time 9211    PT Stop Time 1230    PT Time Calculation (min) 55 min    Behavior During Therapy [facility] for tasks assessed/performed           Past Medical History:  Diagnosis Date  . Arthritis   . Benign essential HTN   . Colon polyps    TA 2009  . History of kidney stones   . Hyperlipidemia   . Rheumatoid arthritis ([place])   . Vancomycin adverse reaction 05/10/2020    Past Surgical History:  Procedure Laterality Date  . arm fracture surgery     as child   . COLONOSCOPY    . HERNIA REPAIR    . I & D EXTREMITY Left 04/04/2020   Procedure: LEFT KNEE OPEN DEBRIDEMENT;  Surgeon: Newt Minion, MD;  Location: [place];  Service: Orthopedics;  Laterality: Left;  . MENISCUS REPAIR Left   . POLYPECTOMY    . SYNOVECTOMY Left 04/04/2020   LEFT KNEE OPEN synovectomy both anterior posterior and medial and lateral gutters    There were no vitals filed for this visit.   Subjective Assessment - 06/12/20 1135    Subjective He has doing his exercises at least once per day. No buckling.  The swelling is improving.    Limitations Standing;Walking    Patient Stated Goals return to 100% - be able to kneel on knee    Currently in Pain? No/denies                             [facility] Adult PT Treatment/Exercise - 06/12/20 1135      Therapeutic Activites    Therapeutic Activities Work Simulation    Work Simulation kneeling on foam 1 minute able to get in & out pushing on floor      Knee/Hip Exercises: Clinical research associate  Left;3 reps;30 seconds    Press photographer Limitations slant board      Knee/Hip Exercises: Facilities manager Shuttle leg press  BLEs with ball squeeze 150# 15 reps 2 sets;  LLE 75# 15 reps 1 set      Knee/Hip Exercises: Standing   Hip Flexion Stengthening;Left;1 set;15 reps;Knee straight    Hip Flexion Limitations RLE kick with green theraband and left knee blue theraband terminal knee extension hold    Terminal Knee Extension Strengthening;Left;3 sets;15 reps;Theraband    Theraband Level (Terminal Knee Extension) Level 3 (Green)    Terminal Knee Extension Limitations 1st set bilateral stance  2nd set intial contact; 3rd set terminal stance    Hip ADduction Strengthening;Left;1 set;15 reps    Hip ADduction Limitations RLE kick with green theraband and left knee blue theraband terminal knee extension hold    Hip Abduction Stengthening;Left;1 set;15 reps;Knee straight    Abduction Limitations RLE kick with green theraband and left knee blue theraband terminal knee extension hold    Hip Extension Stengthening;Left;1 set;15 reps;Knee straight  Extension Limitations RLE kick with green theraband and left knee blue theraband terminal knee extension hold    Functional Squat 15 reps;3 seconds;2 sets   ball squeeze   Functional Squat Limitations 1st set without TRX &  2nd with TRX    Other Standing Knee Exercises dead lift with bar 70# 15 reps with cues     Other Standing Knee Exercises squat with bar weight machine 70# 15 reps      Knee/Hip Exercises: Seated   Other Seated Knee/Hip Exercises SLR LLE 15 reps 1 set straight & 1 set w/ER      Modalities   Modalities Vasopneumatic      Vasopneumatic   Number Minutes Vasopneumatic  10 minutes    Vasopnuematic Location  Knee    Vasopneumatic Pressure High    Vasopneumatic Temperature  34                       PT Long Term Goals - 05/30/20 1026      PT LONG TERM GOAL #1   Title independent with HEP      Status New    Target Date 07/11/20      PT LONG TERM GOAL #2   Title improve Lt knee AROM 0-135 for improved function    Status New    Target Date 07/11/20      PT LONG TERM GOAL #3   Title demonstrate improved Lt knee strength to 5/5 for improved function and better ease of negotiating stairs    Status New    Target Date 07/11/20      PT LONG TERM GOAL #4   Title amb without gait deviation for improved function    Status New    Target Date 07/11/20                 Plan - 06/12/20 1135    Clinical Impression Statement Patient responded to strength training exercises today.  PT recommended considering go back to work 4hr days and progressing to full days.    Personal Factors and Comorbidities Comorbidity 2;Past/Current Experience    Comorbidities RA, HTN    Examination-Activity Limitations Squat;Stairs;Stand;Locomotion Level;Transfers    Examination-Participation Restrictions Community Activity;Occupation    Stability/Clinical Decision Making Stable/Uncomplicated    Rehab Potential Good    PT Frequency 1x / week    PT Duration 6 weeks    PT Treatment/Interventions ADLs/Self Care Home Management;Cryotherapy;Electrical Stimulation;Moist Heat;Gait training;Stair training;Functional mobility training;Therapeutic activities;Therapeutic exercise;Balance training;Neuromuscular re-education;Patient/family education;Manual techniques;Vasopneumatic Device;Taping;Passive range of motion;Dry needling    PT Next Visit Plan review HEP, if good quad activation progress HEP to more aggressive strengthening, vaso for swelling PRN    PT Home Exercise Plan Access Code: [email or web address]    Consulted and Agree with Plan of Care Patient           Patient will benefit from skilled therapeutic intervention in order to improve the following deficits and impairments:  Abnormal gait, Increased edema, Pain, Difficulty walking, Decreased mobility, Decreased range of motion, Impaired flexibility, Decreased  strength  Visit Diagnosis: Stiffness of left knee, not elsewhere classified  Other abnormalities of gait and mobility  Muscle weakness (generalized)  Localized edema     Problem List Patient Active Problem List   Diagnosis Date Noted  . Vancomycin adverse reaction 05/10/2020  . Acute renal failure (ARF) ([place]) 04/26/2020  . HLD (hyperlipidemia) 04/26/2020  . HTN (hypertension) 04/26/2020  . RA (rheumatoid arthritis) ([place]) 04/26/2020  . Septic infrapatellar  bursitis of left knee 04/04/2020  . Pyogenic arthritis of left knee joint ([place])   . RECTAL BLEEDING 08/15/2008    Jamey Reas PT, DPT 06/12/2020, 12:51 PM  [facility] Physical Therapy [address] [place], [place], [address] Phone: [phone]   Fax:  [phone]  Name: Eric Murray MRN: [id number] Date of Birth: November 16, 1960

## 2020-06-12 NOTE — Progress Notes (Signed)
Office Visit Note   Patient: Eric Murray           Date of Birth: 1960-09-27           MRN: [id number] Visit Date: 06/12/2020              Requested by: Leanna Battles, [place],  [place] [address] PCP: Leanna Battles, MD  Chief Complaint  Patient presents with  . Left Knee - Routine Post Op    04/04/20 left knee open debridement       HPI: Patient is a 59 year old gentleman who presents 9 weeks status post extensive debridement synovectomy septic left knee patient feels like he is doing well has no complaints states he feels like he has some scar tissue proximally.  Assessment & Plan: Visit Diagnoses:  1. Pyogenic arthritis of left knee joint, due to unspecified organism [facility])     Plan: Patient was given instructions for scar massage strengthening, he is given a note to return to work without restrictions.  Follow-Up Instructions: Return if symptoms worsen or fail to improve.   Ortho Exam  Patient is alert, oriented, no adenopathy, well-dressed, normal affect, normal respiratory effort. Examination patient has a slight antalgic gait he has good range of motion of the left knee there is still some swelling there is no redness no cellulitis the incision is healed nicely no keloiding he does have some scar tissue proximally and he was given instructions for scar massage.  Imaging: No results found. No images are attached to the encounter.  Labs: Lab Results  Component Value Date   ESRSEDRATE 9 05/29/2020   ESRSEDRATE 25 (H) 04/26/2020   ESRSEDRATE 68 (H) 04/05/2020   CRP 2.2 05/29/2020   CRP 4.2 (H) 04/26/2020   CRP 4.1 (H) 04/05/2020   REPTSTATUS 05/02/2020 FINAL 04/26/2020   GRAMSTAIN  04/04/2020    ABUNDANT WBC PRESENT,BOTH PMN AND MONONUCLEAR NO ORGANISMS SEEN    CULT  04/26/2020    NO GROWTH 6 DAYS Performed at [facility], [address], [place], [place] [address]      Lab Results  Component Value Date   ALBUMIN 2.6 (L)  04/29/2020   ALBUMIN 2.4 (L) 04/28/2020   ALBUMIN 2.3 (L) 04/27/2020    Lab Results  Component Value Date   MG 1.9 04/27/2020   No results found for: VD25OH  No results found for: PREALBUMIN CBC EXTENDED Latest Ref Rng & Units 05/29/2020 04/26/2020 04/04/2020  WBC 3.8 - 10.8 Thousand/uL 4.9 6.0 7.8  RBC 4.20 - 5.80 Million/uL 4.38 3.63(L) 4.25  HGB 13.2 - 17.1 g/dL 12.0(L) 9.8(L) 11.8(L)  HCT 38 - 50 % 37.4(L) 31.1(L) 38.1(L)  PLT 140 - 400 Thousand/uL 331 302 459(H)  NEUTROABS 1,500 - 7,800 cells/uL 1,808 3.7 -  LYMPHSABS 850 - 3,900 cells/uL 1,950 0.9 -     Body mass index is 23.99 kg/m.  Orders:  No orders of the defined types were placed in this encounter.  No orders of the defined types were placed in this encounter.    Procedures: No procedures performed  Clinical Data: No additional findings.  ROS:  All other systems negative, except as noted in the HPI. Review of Systems  Objective: Vital Signs: Ht 5\' 11"  (1.803 m)   Wt 172 lb (78 kg)   BMI 23.99 kg/m   Specialty Comments:  No specialty comments available.  PMFS History: Patient Active Problem List   Diagnosis Date Noted  . Vancomycin adverse reaction 05/10/2020  .  Acute renal failure (ARF) ([place]) 04/26/2020  . HLD (hyperlipidemia) 04/26/2020  . HTN (hypertension) 04/26/2020  . RA (rheumatoid arthritis) ([place]) 04/26/2020  . Septic infrapatellar bursitis of left knee 04/04/2020  . Pyogenic arthritis of left knee joint ([place])   . RECTAL BLEEDING 08/15/2008   Past Medical History:  Diagnosis Date  . Arthritis   . Benign essential HTN   . Colon polyps    TA 2009  . History of kidney stones   . Hyperlipidemia   . Rheumatoid arthritis ([place])   . Vancomycin adverse reaction 05/10/2020    Family History  Problem Relation Age of Onset  . Cataracts Mother 37       Pt died of old age  . Pancreatic cancer Father 30  . Prostate cancer Father   . Breast cancer Sister   . Cushing syndrome Brother   .  Colon cancer Neg Hx   . Colon polyps Neg Hx     Past Surgical History:  Procedure Laterality Date  . arm fracture surgery     as child   . COLONOSCOPY    . HERNIA REPAIR    . I & D EXTREMITY Left 04/04/2020   Procedure: LEFT KNEE OPEN DEBRIDEMENT;  Surgeon: Newt Minion, MD;  Location: [facility];  Service: Orthopedics;  Laterality: Left;  . MENISCUS REPAIR Left   . POLYPECTOMY    . SYNOVECTOMY Left 04/04/2020   LEFT KNEE OPEN synovectomy both anterior posterior and medial and lateral gutters   Social History   Occupational History  . Occupation: ELECTRICIAN    Employer: [employer]  Tobacco Use  . Smoking status: Former Smoker    Packs/day: 1.00    Types: Cigarettes    Quit date: 04/04/2019    Years since quitting: 1.1  . Smokeless tobacco: Never Used  Vaping Use  . Vaping Use: Every day  Substance and Sexual Activity  . Alcohol use: Yes    Alcohol/week: 18.0 standard drinks    Types: 18 Cans of beer per week    Comment: mostly weekends  . Drug use: No  . Sexual activity: Not on file

## 2020-06-19 ENCOUNTER — Telehealth: Payer: Self-pay | Admitting: Rehabilitative and Restorative Service Providers"

## 2020-06-19 ENCOUNTER — Encounter: Payer: BC Managed Care – PPO | Admitting: Rehabilitative and Restorative Service Providers"

## 2020-06-19 NOTE — Telephone Encounter (Signed)
Called and LVM about missed appointment today and reminded of next appointment time.  Scot Jun, PT, DPT, OCS, ATC 06/19/20  4:16 PM

## 2020-06-27 ENCOUNTER — Encounter: Payer: BC Managed Care – PPO | Admitting: Rehabilitative and Restorative Service Providers"

## 2020-06-27 ENCOUNTER — Telehealth: Payer: Self-pay | Admitting: Rehabilitative and Restorative Service Providers"

## 2020-06-27 NOTE — Telephone Encounter (Signed)
Pt. Answered call and indicated he was out of town.  Confirmed desire to attend next scheduled appointment time oct 27 at [place], PT, DPT, OCS, ATC 06/27/20  4:12 PM

## 2020-07-04 ENCOUNTER — Encounter: Payer: BC Managed Care – PPO | Admitting: Rehabilitative and Restorative Service Providers"

## 2020-07-09 ENCOUNTER — Other Ambulatory Visit: Payer: Self-pay

## 2020-07-09 ENCOUNTER — Encounter: Payer: Self-pay | Admitting: Rehabilitative and Restorative Service Providers"

## 2020-07-09 ENCOUNTER — Ambulatory Visit: Payer: BC Managed Care – PPO | Admitting: Rehabilitative and Restorative Service Providers"

## 2020-07-09 DIAGNOSIS — M6281 Muscle weakness (generalized): Secondary | ICD-10-CM | POA: Diagnosis not present

## 2020-07-09 DIAGNOSIS — M25662 Stiffness of left knee, not elsewhere classified: Secondary | ICD-10-CM | POA: Diagnosis not present

## 2020-07-09 DIAGNOSIS — R2689 Other abnormalities of gait and mobility: Secondary | ICD-10-CM

## 2020-07-09 DIAGNOSIS — R600 Localized edema: Secondary | ICD-10-CM

## 2020-07-09 NOTE — Therapy (Addendum)
[facility] Physical Therapy [address] [place], [place], [address] Phone: [phone]   Fax:  [phone]  Physical Therapy Treatment/DC PHYSICAL THERAPY DISCHARGE SUMMARY  Visits from Start of Care: 3  Current functional level related to goals / functional outcomes: Not available   Remaining deficits: Not available    Education / Equipment: HEP.  [name] attended 3 of 7 appointments with 3 no shows.  DC due to patient non-compliance. Plan:                                                    Patient goals were not met. Patient is being discharged due to not returning since the last visit.  ?????      Patient Details  Name: Eric Murray MRN: [id number] Date of Birth: 15-Jan-1961 Referring Provider (PT): Persons, Bevely Palmer, [place]   Encounter Date: 07/09/2020   PT End of Session - 07/09/20 1545    Visit Number 3    Number of Visits 10    Date for PT Re-Evaluation 09/03/20    Progress Note Due on Visit 13    PT Start Time 5974    PT Stop Time 1617    PT Time Calculation (min) 39 min    Activity Tolerance Patient tolerated treatment well    Behavior During Therapy [facility] for tasks assessed/performed           Past Medical History:  Diagnosis Date  . Arthritis   . Benign essential HTN   . Colon polyps    TA 2009  . History of kidney stones   . Hyperlipidemia   . Rheumatoid arthritis ([place])   . Vancomycin adverse reaction 05/10/2020    Past Surgical History:  Procedure Laterality Date  . arm fracture surgery     as child   . COLONOSCOPY    . HERNIA REPAIR    . I & D EXTREMITY Left 04/04/2020   Procedure: LEFT KNEE OPEN DEBRIDEMENT;  Surgeon: Newt Minion, MD;  Location: [place];  Service: Orthopedics;  Laterality: Left;  . MENISCUS REPAIR Left   . POLYPECTOMY    . SYNOVECTOMY Left 04/04/2020   LEFT KNEE OPEN synovectomy both anterior posterior and medial and lateral gutters    There were no vitals filed for this visit.   Subjective  Assessment - 07/09/20 1539    Subjective Pt. stated sore today in superior aspect of anterior knee.  Pt. stated walking a lot at work.  Symptoms at worst 3-4/10.  Pt. stated overall improvement to 70-80% range at this time.    Limitations Standing;Walking    Patient Stated Goals return to 100% - be able to kneel on knee    Currently in Pain? Yes    Pain Score 2     Pain Location Knee    Pain Orientation Left    Pain Descriptors / Indicators Sore    Pain Type Surgical pain    Pain Onset More than a month ago    Pain Frequency Intermittent    Aggravating Factors  walking, ladder              [facility] PT Assessment - 07/09/20 0001      Assessment   Medical Diagnosis M25.562,G89.29 (ICD-10-CM) - Chronic pain of left knee    Referring Provider (PT) Persons, Bevely Palmer, [place]  Onset Date/Surgical Date 04/04/20    Hand Dominance Right      Functional Tests   Functional tests Single leg stance;Step down      Step Down   Comments fair control on Lt LE       Single Leg Stance   Comments Rt 30 seconds, Lt 15 seconds, increased aberrant movement      AROM   Left Knee Extension 2    Left Knee Flexion 130      Strength   Left Knee Flexion 5/5    Left Knee Extension 4+/5                         [facility] Adult PT Treatment/Exercise - 07/09/20 0001      Neuro Re-ed    Neuro Re-ed Details  SLS 30 sec x 4 Lt Le, tandem fwd/back 15 ft x 5 each way      Knee/Hip Exercises: Aerobic   Recumbent Bike Lvl 3 10 mins      Knee/Hip Exercises: Machines for Strengthening   Cybex Knee Extension eccentric Lt LE 15 lbs 3 x 10    Cybex Knee Flexion SL 3 x 10 15 lbs      Knee/Hip Exercises: Standing   Lateral Step Up Step Height: 6";Left;2 sets;10 reps   eccentric lowering focus     Knee/Hip Exercises: Seated   Other Seated Knee/Hip Exercises slr x 15 Lt LE                       PT Long Term Goals - 07/09/20 1606      PT LONG TERM GOAL #1   Title independent with  HEP    Time 8    Status Revised    Target Date 09/03/20      PT LONG TERM GOAL #2   Title improve Lt knee AROM 0-135 for improved function    Time 8    Period Weeks    Status Revised    Target Date 09/03/20      PT LONG TERM GOAL #3   Title demonstrate improved Lt knee strength to 5/5 for improved function and better ease of negotiating stairs    Time 8    Period Weeks    Status Revised    Target Date 09/03/20      PT LONG TERM GOAL #4   Title amb without gait deviation for improved function    Status Achieved      PT LONG TERM GOAL #5   Title Pt. will demonstrate bilateral SLSl > 30 seconds to facilitate stability on even/uneven surface ambulation.    Time 8    Period Weeks    Status New    Target Date 09/03/20                 Plan - 07/09/20 1607    Clinical Impression Statement Pt. has attended 3 visits overall during course of treatment, reporting 70-80% overall improvement c symptoms 4/10 at worst.  See objective data for updated information regarding current presentation.  Pt. may continue to benefit from skilled PT services to facilitate reduction in Lt knee pain and improve strength/mobility and movement coordination to facilitate full return to PLOF.    Personal Factors and Comorbidities Comorbidity 2;Past/Current Experience    Comorbidities RA, HTN    Examination-Activity Limitations Squat;Stairs;Stand;Locomotion Level;Transfers    Examination-Participation Restrictions Community Activity;Occupation    Stability/Clinical Decision Making Stable/Uncomplicated  Rehab Potential Good    PT Frequency 1x / week    PT Duration 8 weeks    PT Treatment/Interventions ADLs/Self Care Home Management;Cryotherapy;Electrical Stimulation;Moist Heat;Gait training;Stair training;Functional mobility training;Therapeutic activities;Therapeutic exercise;Balance training;Neuromuscular re-education;Patient/family education;Manual techniques;Vasopneumatic Device;Taping;Passive  range of motion;Dry needling    PT Next Visit Plan Improve HEP performance, compliant surface balance, strengthening    PT Home Exercise Plan Access Code: [email or web address]    Consulted and Agree with Plan of Care Patient           Patient will benefit from skilled therapeutic intervention in order to improve the following deficits and impairments:  Abnormal gait, Increased edema, Pain, Difficulty walking, Decreased mobility, Decreased range of motion, Impaired flexibility, Decreased strength  Visit Diagnosis: Stiffness of left knee, not elsewhere classified  Other abnormalities of gait and mobility  Muscle weakness (generalized)  Localized edema     Problem List Patient Active Problem List   Diagnosis Date Noted  . Vancomycin adverse reaction 05/10/2020  . Acute renal failure (ARF) ([place]) 04/26/2020  . HLD (hyperlipidemia) 04/26/2020  . HTN (hypertension) 04/26/2020  . RA (rheumatoid arthritis) ([place]) 04/26/2020  . Septic infrapatellar bursitis of left knee 04/04/2020  . Pyogenic arthritis of left knee joint ([place])   . RECTAL BLEEDING 08/15/2008   Scot Jun, PT, DPT, OCS, ATC 07/09/20  4:12 PM  Vista Mink, PT, MPT  [facility] Physical Therapy [address] [place], [place], [address] Phone: [phone]   Fax:  [phone]  Name: Eric Murray MRN: [id number] Date of Birth: 29-Aug-1961

## 2020-07-27 ENCOUNTER — Encounter: Payer: BC Managed Care – PPO | Admitting: Rehabilitative and Restorative Service Providers"

## 2020-08-01 ENCOUNTER — Encounter: Payer: BC Managed Care – PPO | Admitting: Physical Therapy

## 2020-08-06 DIAGNOSIS — I10 Essential (primary) hypertension: Secondary | ICD-10-CM | POA: Diagnosis not present

## 2020-08-10 ENCOUNTER — Encounter: Payer: BC Managed Care – PPO | Admitting: Rehabilitative and Restorative Service Providers"

## 2020-08-17 ENCOUNTER — Telehealth: Payer: Self-pay | Admitting: Rehabilitative and Restorative Service Providers"

## 2020-08-17 ENCOUNTER — Encounter: Payer: BC Managed Care – PPO | Admitting: Rehabilitative and Restorative Service Providers"

## 2020-08-17 NOTE — Telephone Encounter (Signed)
Called to check on Eric Murray after his no show today and remind him of his 3:15 PM appointment next Friday 12/17.  No answer and voicemail was full.

## 2020-08-24 ENCOUNTER — Telehealth: Payer: Self-pay | Admitting: Rehabilitative and Restorative Service Providers"

## 2020-08-24 ENCOUNTER — Encounter: Payer: BC Managed Care – PPO | Admitting: Rehabilitative and Restorative Service Providers"

## 2020-08-24 NOTE — Telephone Encounter (Signed)
2nd straight no show with me.  Again, voicemail full.  No other appointments scheduled.  DC completed.

## 2020-10-15 DIAGNOSIS — Z125 Encounter for screening for malignant neoplasm of prostate: Secondary | ICD-10-CM | POA: Diagnosis not present

## 2020-10-15 DIAGNOSIS — E785 Hyperlipidemia, unspecified: Secondary | ICD-10-CM | POA: Diagnosis not present

## 2020-10-19 DIAGNOSIS — E785 Hyperlipidemia, unspecified: Secondary | ICD-10-CM | POA: Diagnosis not present

## 2020-10-19 DIAGNOSIS — R82998 Other abnormal findings in urine: Secondary | ICD-10-CM | POA: Diagnosis not present

## 2020-10-22 DIAGNOSIS — Z0000 Encounter for general adult medical examination without abnormal findings: Secondary | ICD-10-CM | POA: Diagnosis not present

## 2020-10-22 DIAGNOSIS — Z23 Encounter for immunization: Secondary | ICD-10-CM | POA: Diagnosis not present

## 2020-10-22 DIAGNOSIS — I10 Essential (primary) hypertension: Secondary | ICD-10-CM | POA: Diagnosis not present

## 2020-10-22 DIAGNOSIS — E785 Hyperlipidemia, unspecified: Secondary | ICD-10-CM | POA: Diagnosis not present

## 2021-02-04 IMAGING — US US RENAL
1 series · 14 of 25 positions shown · non-contrast
Comparison: CT 04/06/2012

CLINICAL DATA: Acute renal failure.

EXAM:
RENAL / URINARY TRACT ULTRASOUND COMPLETE

[Series 1: us renal · 14 of 34 slices shown]
[im 1/34]
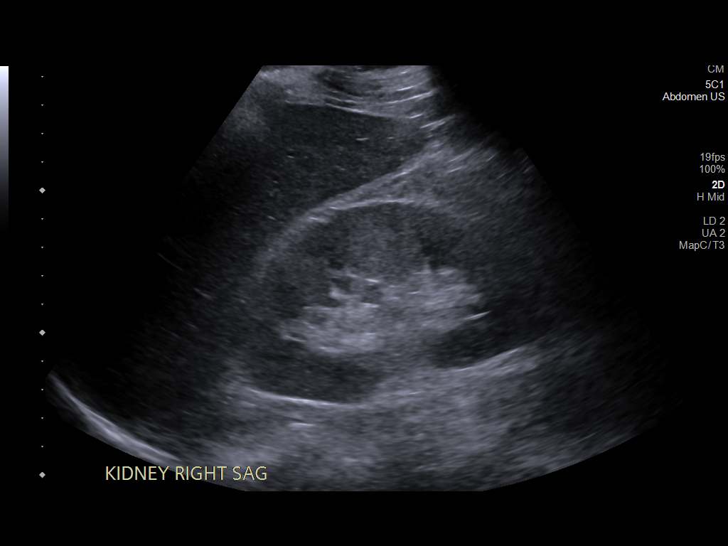
[im 3/34]
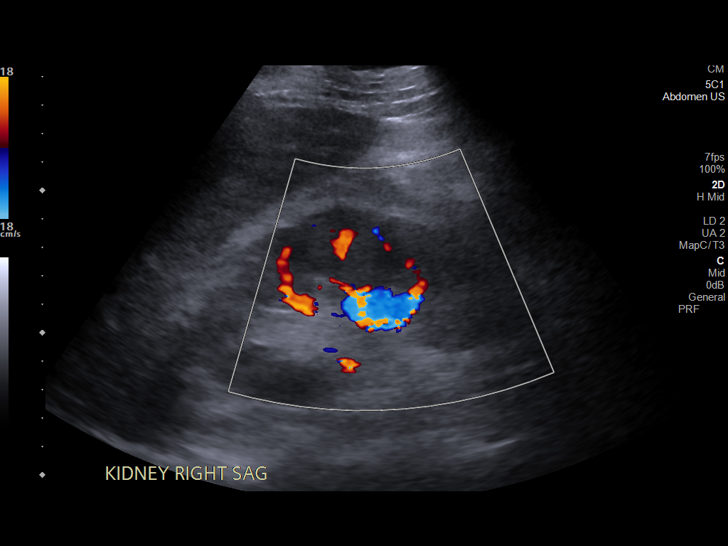
[im 6/34]
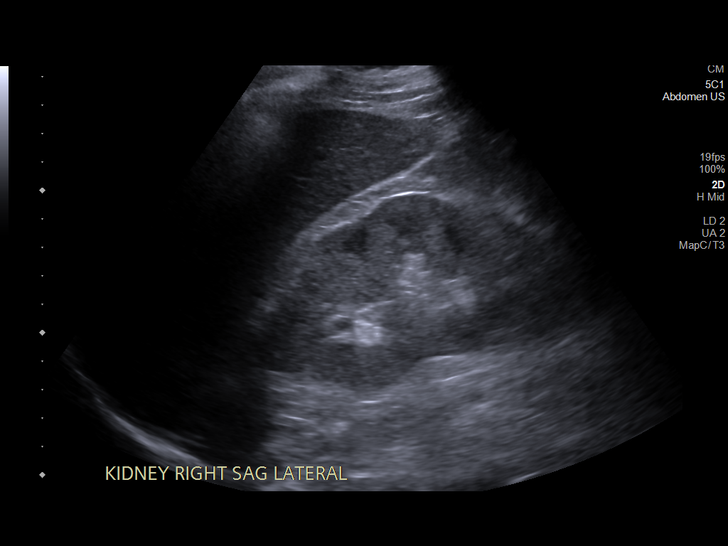
[im 9/34]
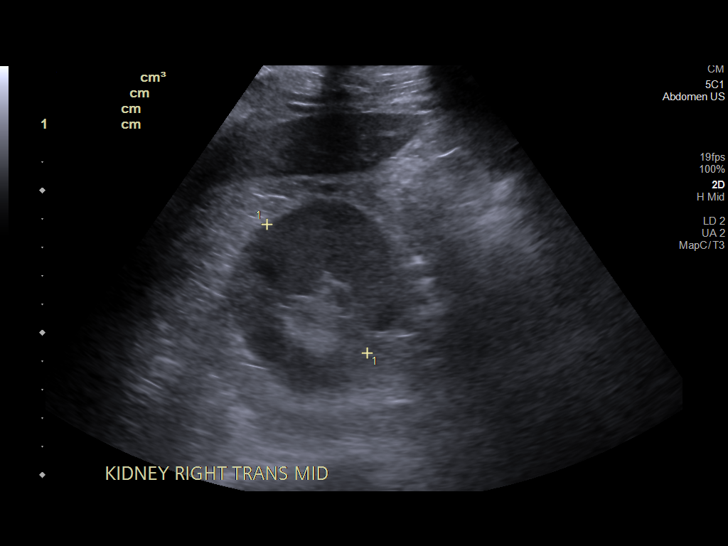
[im 12/34]
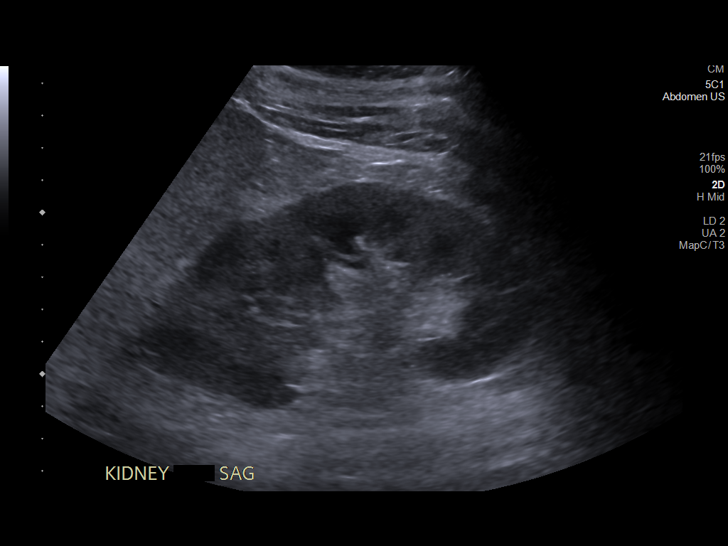
[im 13/34]
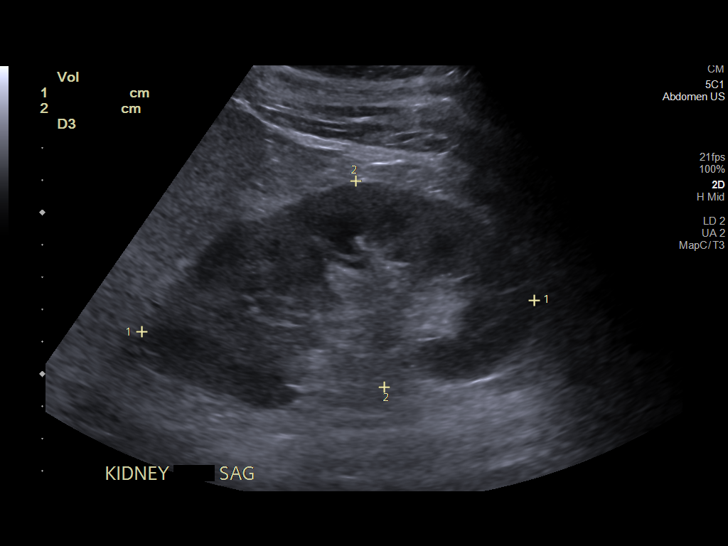
[im 16/34]
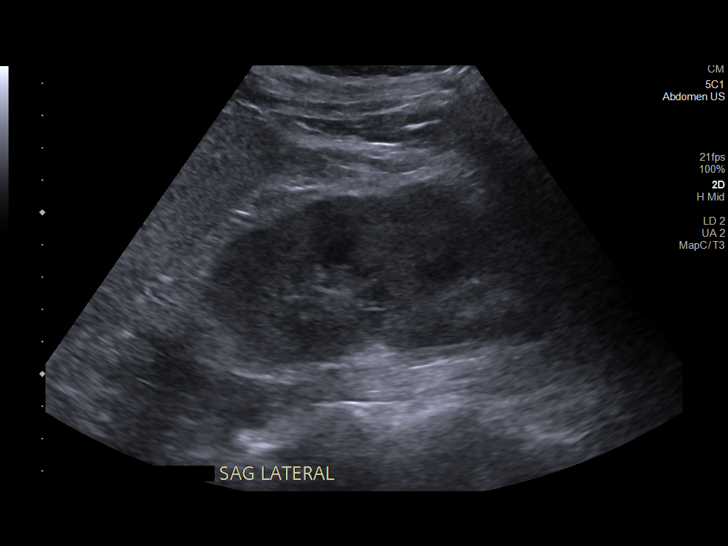
[im 18/34]
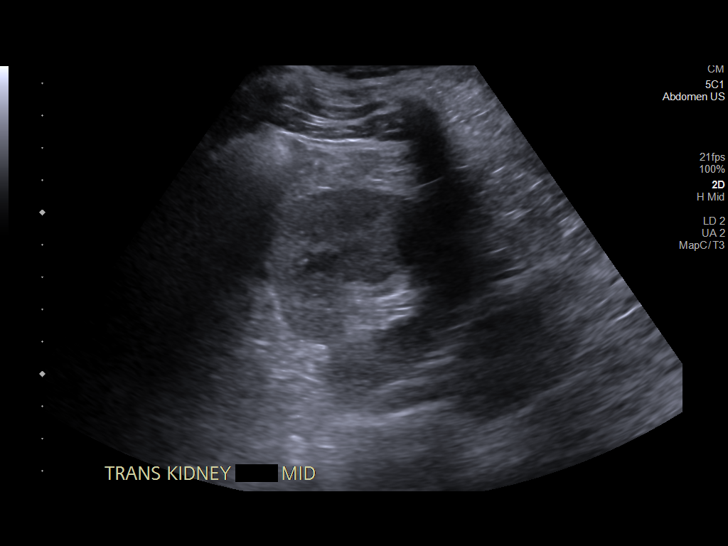
[im 21/34]
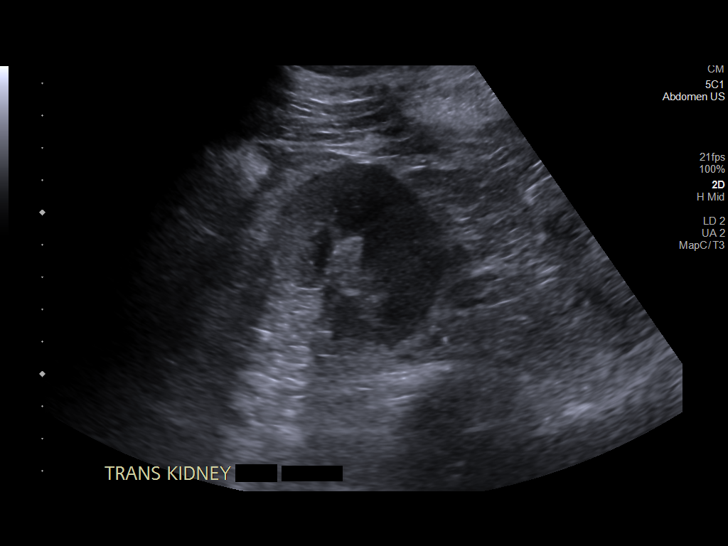
[im 23/34]
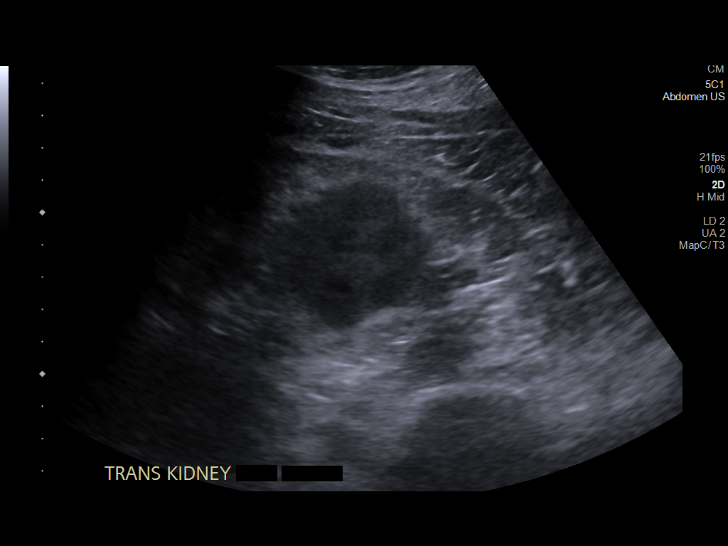
[im 25/34]
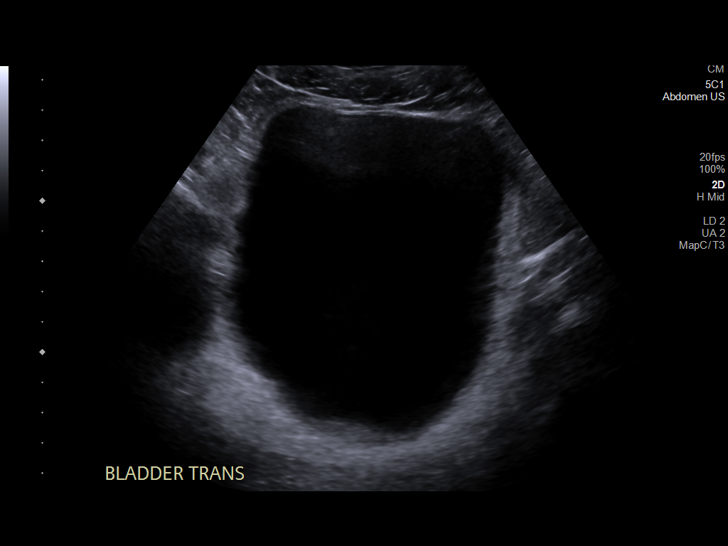
[im 28/34]
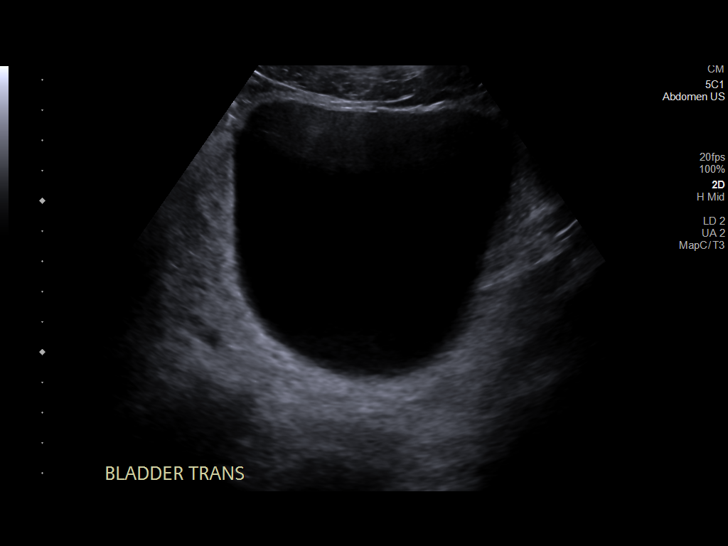
[im 31/34]
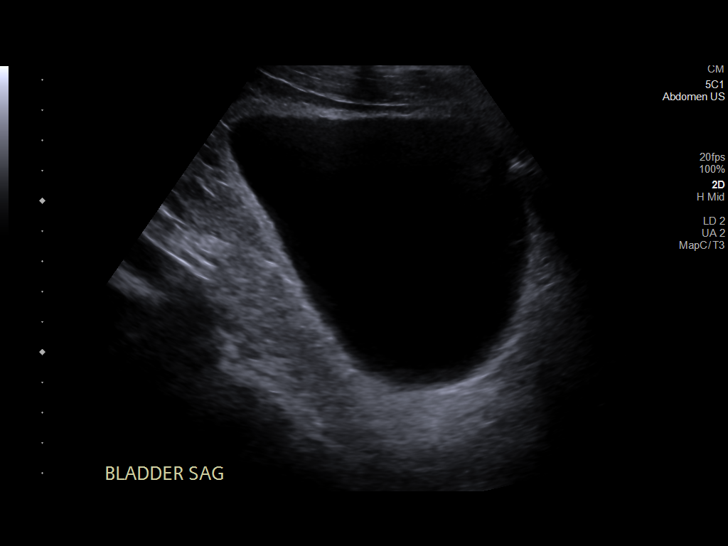
[im 34/34]
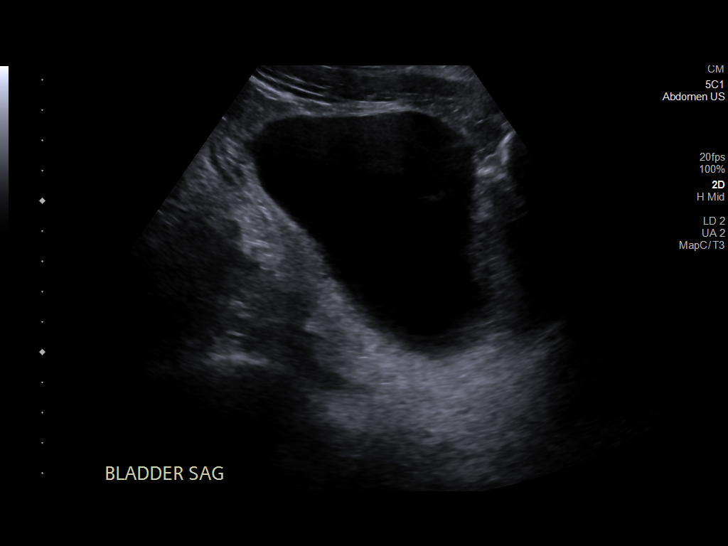

[14 of 25 positions shown; findings below may reference images not displayed]

FINDINGS: Right Kidney:

Renal measurements: 11.1 x 6.4 x 5.7 cm = volume: 214 mL.
Echogenicity slightly increased. No mass or hydronephrosis
visualized.

Left Kidney:

Renal measurements: 12.2 x 6.4 x 6.2 cm = volume: 257 mL.
Echogenicity slightly increased. No mass or hydronephrosis
visualized.

Bladder:

Appears normal for degree of bladder distention.

Other:

None.
IMPRESSION: Slightly enlarged, slightly echogenic kidneys suggesting acute
nephritis. No obstruction.

## 2021-04-18 DIAGNOSIS — E2740 Unspecified adrenocortical insufficiency: Secondary | ICD-10-CM | POA: Diagnosis not present

## 2021-04-18 DIAGNOSIS — I10 Essential (primary) hypertension: Secondary | ICD-10-CM | POA: Diagnosis not present

## 2021-04-18 DIAGNOSIS — R7301 Impaired fasting glucose: Secondary | ICD-10-CM | POA: Diagnosis not present

## 2021-04-23 DIAGNOSIS — I10 Essential (primary) hypertension: Secondary | ICD-10-CM | POA: Diagnosis not present

## 2021-04-23 DIAGNOSIS — M069 Rheumatoid arthritis, unspecified: Secondary | ICD-10-CM | POA: Diagnosis not present

## 2021-04-23 DIAGNOSIS — L509 Urticaria, unspecified: Secondary | ICD-10-CM | POA: Diagnosis not present

## 2021-06-03 ENCOUNTER — Other Ambulatory Visit: Payer: Self-pay

## 2021-06-03 ENCOUNTER — Ambulatory Visit: Payer: Self-pay

## 2021-06-03 ENCOUNTER — Ambulatory Visit (INDEPENDENT_AMBULATORY_CARE_PROVIDER_SITE_OTHER): Payer: BC Managed Care – PPO | Admitting: Orthopedic Surgery

## 2021-06-03 DIAGNOSIS — G8929 Other chronic pain: Secondary | ICD-10-CM | POA: Diagnosis not present

## 2021-06-03 DIAGNOSIS — M25562 Pain in left knee: Secondary | ICD-10-CM | POA: Diagnosis not present

## 2021-07-02 ENCOUNTER — Encounter: Payer: Self-pay | Admitting: Orthopedic Surgery

## 2021-07-02 NOTE — Progress Notes (Signed)
Office Visit Note   Patient: Eric Murray           Date of Birth: February 22, 1961           MRN: [id number] Visit Date: 06/03/2021              Requested by: Leanna Battles, [place],  [place] [address] PCP: Leanna Battles, MD  Chief Complaint  Patient presents with   Left Knee - Pain    Hx 04/04/20 open debridement septic knee      HPI: Patient is a 60 year old gentleman who presents complaining of pain and swelling of the left knee he states the pain is global pain with ambulation he has been using Advil without relief.  Patient is about 14 months out from open debridement for a septic knee.  Debridement on 04/04/2020.  Assessment & Plan: Visit Diagnoses:  1. Chronic pain of left knee     Plan: Recommended Voltaren gel recommended VMO quad and hamstring strengthening to unload pressure from the joint space.  Follow-Up Instructions: Return if symptoms worsen or fail to improve.   Ortho Exam  Patient is alert, oriented, no adenopathy, well-dressed, normal affect, normal respiratory effort. Examination patient has crepitation with range of motion of the patellofemoral joint there is a mild effusion there is no tenderness to palpation of the medial and lateral joint line collaterals are cruciates are stable.  There is no redness no cellulitis no signs of infection.  Imaging: No results found. No images are attached to the encounter.  Labs: Lab Results  Component Value Date   ESRSEDRATE 9 05/29/2020   ESRSEDRATE 25 (H) 04/26/2020   ESRSEDRATE 68 (H) 04/05/2020   CRP 2.2 05/29/2020   CRP 4.2 (H) 04/26/2020   CRP 4.1 (H) 04/05/2020   REPTSTATUS 05/02/2020 FINAL 04/26/2020   GRAMSTAIN  04/04/2020    ABUNDANT WBC PRESENT,BOTH PMN AND MONONUCLEAR NO ORGANISMS SEEN    CULT  04/26/2020    NO GROWTH 6 DAYS Performed at [facility], [address] [place], [place] [address]      Lab Results  Component Value Date   ALBUMIN 2.6 (L) 04/29/2020    ALBUMIN 2.4 (L) 04/28/2020   ALBUMIN 2.3 (L) 04/27/2020    Lab Results  Component Value Date   MG 1.9 04/27/2020   No results found for: VD25OH  No results found for: PREALBUMIN CBC EXTENDED Latest Ref Rng & Units 05/29/2020 04/26/2020 04/04/2020  WBC 3.8 - 10.8 Thousand/uL 4.9 6.0 7.8  RBC 4.20 - 5.80 Million/uL 4.38 3.63(L) 4.25  HGB 13.2 - 17.1 g/dL 12.0(L) 9.8(L) 11.8(L)  HCT 38.5 - 50.0 % 37.4(L) 31.1(L) 38.1(L)  PLT 140 - 400 Thousand/uL 331 302 459(H)  NEUTROABS 1,500 - 7,800 cells/uL 1,808 3.7 -  LYMPHSABS 850 - 3,900 cells/uL 1,950 0.9 -     There is no height or weight on file to calculate BMI.  Orders:  Orders Placed This Encounter  Procedures   XR Knee 1-2 Views Left   No orders of the defined types were placed in this encounter.    Procedures: No procedures performed  Clinical Data: No additional findings.  ROS:  All other systems negative, except as noted in the HPI. Review of Systems  Objective: Vital Signs: There were no vitals taken for this visit.  Specialty Comments:  No specialty comments available.  PMFS History: Patient Active Problem List   Diagnosis Date Noted   Vancomycin adverse reaction 05/10/2020   Acute renal  failure (ARF) ([place]) 04/26/2020   HLD (hyperlipidemia) 04/26/2020   HTN (hypertension) 04/26/2020   RA (rheumatoid arthritis) ([place]) 04/26/2020   Septic infrapatellar bursitis of left knee 04/04/2020   Pyogenic arthritis of left knee joint ([place])    RECTAL BLEEDING 08/15/2008   Past Medical History:  Diagnosis Date   Arthritis    Benign essential HTN    Colon polyps    TA 2009   History of kidney stones    Hyperlipidemia    Rheumatoid arthritis ([facility])    Vancomycin adverse reaction 05/10/2020    Family History  Problem Relation Age of Onset   Cataracts Mother 43       Pt died of old age   Pancreatic cancer Father 43   Prostate cancer Father    Breast cancer Sister    Cushing syndrome Brother    Colon cancer Neg  Hx    Colon polyps Neg Hx     Past Surgical History:  Procedure Laterality Date   arm fracture surgery     as child    COLONOSCOPY     HERNIA REPAIR     I & D EXTREMITY Left 04/04/2020   Procedure: LEFT KNEE OPEN DEBRIDEMENT;  Surgeon: Newt Minion, MD;  Location: [place];  Service: Orthopedics;  Laterality: Left;   MENISCUS REPAIR Left    POLYPECTOMY     SYNOVECTOMY Left 04/04/2020   LEFT KNEE OPEN synovectomy both anterior posterior and medial and lateral gutters   Social History   Occupational History   Occupation: ELECTRICIAN    Employer: [employer]  Tobacco Use   Smoking status: Former    Packs/day: 1.00    Types: Cigarettes    Quit date: 04/04/2019    Years since quitting: 2.2   Smokeless tobacco: Never  Vaping Use   Vaping Use: Every day  Substance and Sexual Activity   Alcohol use: Yes    Alcohol/week: 18.0 standard drinks    Types: 18 Cans of beer per week    Comment: mostly weekends   Drug use: No   Sexual activity: Not on file

## 2021-07-19 DIAGNOSIS — Z23 Encounter for immunization: Secondary | ICD-10-CM | POA: Diagnosis not present

## 2021-07-19 DIAGNOSIS — M069 Rheumatoid arthritis, unspecified: Secondary | ICD-10-CM | POA: Diagnosis not present

## 2021-08-12 DIAGNOSIS — M7989 Other specified soft tissue disorders: Secondary | ICD-10-CM | POA: Diagnosis not present

## 2021-08-12 DIAGNOSIS — M25462 Effusion, left knee: Secondary | ICD-10-CM | POA: Diagnosis not present

## 2021-08-12 DIAGNOSIS — M2550 Pain in unspecified joint: Secondary | ICD-10-CM | POA: Diagnosis not present

## 2021-08-12 DIAGNOSIS — M0579 Rheumatoid arthritis with rheumatoid factor of multiple sites without organ or systems involvement: Secondary | ICD-10-CM | POA: Diagnosis not present

## 2021-10-08 DIAGNOSIS — Z79899 Other long term (current) drug therapy: Secondary | ICD-10-CM | POA: Diagnosis not present

## 2021-10-08 DIAGNOSIS — M0579 Rheumatoid arthritis with rheumatoid factor of multiple sites without organ or systems involvement: Secondary | ICD-10-CM | POA: Diagnosis not present

## 2021-10-08 DIAGNOSIS — M2550 Pain in unspecified joint: Secondary | ICD-10-CM | POA: Diagnosis not present

## 2021-12-19 DIAGNOSIS — M2550 Pain in unspecified joint: Secondary | ICD-10-CM | POA: Diagnosis not present

## 2021-12-19 DIAGNOSIS — M0579 Rheumatoid arthritis with rheumatoid factor of multiple sites without organ or systems involvement: Secondary | ICD-10-CM | POA: Diagnosis not present

## 2021-12-19 DIAGNOSIS — Z79899 Other long term (current) drug therapy: Secondary | ICD-10-CM | POA: Diagnosis not present

## 2022-04-30 DIAGNOSIS — M0579 Rheumatoid arthritis with rheumatoid factor of multiple sites without organ or systems involvement: Secondary | ICD-10-CM | POA: Diagnosis not present

## 2022-04-30 DIAGNOSIS — M2550 Pain in unspecified joint: Secondary | ICD-10-CM | POA: Diagnosis not present

## 2022-04-30 DIAGNOSIS — Z79899 Other long term (current) drug therapy: Secondary | ICD-10-CM | POA: Diagnosis not present

## 2022-06-09 DIAGNOSIS — Z23 Encounter for immunization: Secondary | ICD-10-CM | POA: Diagnosis not present

## 2022-06-09 DIAGNOSIS — I129 Hypertensive chronic kidney disease with stage 1 through stage 4 chronic kidney disease, or unspecified chronic kidney disease: Secondary | ICD-10-CM | POA: Diagnosis not present

## 2022-10-24 DIAGNOSIS — M2550 Pain in unspecified joint: Secondary | ICD-10-CM | POA: Diagnosis not present

## 2022-10-24 DIAGNOSIS — M79641 Pain in right hand: Secondary | ICD-10-CM | POA: Diagnosis not present

## 2022-10-24 DIAGNOSIS — M0579 Rheumatoid arthritis with rheumatoid factor of multiple sites without organ or systems involvement: Secondary | ICD-10-CM | POA: Diagnosis not present

## 2022-10-24 DIAGNOSIS — Z79899 Other long term (current) drug therapy: Secondary | ICD-10-CM | POA: Diagnosis not present

## 2022-11-13 DIAGNOSIS — E785 Hyperlipidemia, unspecified: Secondary | ICD-10-CM | POA: Diagnosis not present

## 2022-11-13 DIAGNOSIS — Z125 Encounter for screening for malignant neoplasm of prostate: Secondary | ICD-10-CM | POA: Diagnosis not present

## 2022-11-13 DIAGNOSIS — I10 Essential (primary) hypertension: Secondary | ICD-10-CM | POA: Diagnosis not present

## 2022-11-16 DIAGNOSIS — Z1212 Encounter for screening for malignant neoplasm of rectum: Secondary | ICD-10-CM | POA: Diagnosis not present

## 2022-11-17 DIAGNOSIS — R82998 Other abnormal findings in urine: Secondary | ICD-10-CM | POA: Diagnosis not present

## 2022-11-17 DIAGNOSIS — I10 Essential (primary) hypertension: Secondary | ICD-10-CM | POA: Diagnosis not present

## 2022-11-20 DIAGNOSIS — M79641 Pain in right hand: Secondary | ICD-10-CM | POA: Diagnosis not present

## 2022-11-20 DIAGNOSIS — M0579 Rheumatoid arthritis with rheumatoid factor of multiple sites without organ or systems involvement: Secondary | ICD-10-CM | POA: Diagnosis not present

## 2022-11-20 DIAGNOSIS — Z1339 Encounter for screening examination for other mental health and behavioral disorders: Secondary | ICD-10-CM | POA: Diagnosis not present

## 2022-11-20 DIAGNOSIS — I10 Essential (primary) hypertension: Secondary | ICD-10-CM | POA: Diagnosis not present

## 2022-11-20 DIAGNOSIS — G8929 Other chronic pain: Secondary | ICD-10-CM | POA: Diagnosis not present

## 2022-11-20 DIAGNOSIS — Z0000 Encounter for general adult medical examination without abnormal findings: Secondary | ICD-10-CM | POA: Diagnosis not present

## 2022-11-20 DIAGNOSIS — Z1331 Encounter for screening for depression: Secondary | ICD-10-CM | POA: Diagnosis not present

## 2022-11-20 DIAGNOSIS — E785 Hyperlipidemia, unspecified: Secondary | ICD-10-CM | POA: Diagnosis not present

## 2023-01-06 ENCOUNTER — Encounter: Payer: Self-pay | Admitting: Internal Medicine

## 2023-01-23 DIAGNOSIS — Z79899 Other long term (current) drug therapy: Secondary | ICD-10-CM | POA: Diagnosis not present

## 2023-01-23 DIAGNOSIS — M79641 Pain in right hand: Secondary | ICD-10-CM | POA: Diagnosis not present

## 2023-01-23 DIAGNOSIS — M0579 Rheumatoid arthritis with rheumatoid factor of multiple sites without organ or systems involvement: Secondary | ICD-10-CM | POA: Diagnosis not present

## 2023-06-05 DIAGNOSIS — Z79899 Other long term (current) drug therapy: Secondary | ICD-10-CM | POA: Diagnosis not present

## 2023-06-05 DIAGNOSIS — M79642 Pain in left hand: Secondary | ICD-10-CM | POA: Diagnosis not present

## 2023-06-05 DIAGNOSIS — M0579 Rheumatoid arthritis with rheumatoid factor of multiple sites without organ or systems involvement: Secondary | ICD-10-CM | POA: Diagnosis not present

## 2023-06-05 DIAGNOSIS — M79641 Pain in right hand: Secondary | ICD-10-CM | POA: Diagnosis not present

## 2023-07-31 DIAGNOSIS — M0579 Rheumatoid arthritis with rheumatoid factor of multiple sites without organ or systems involvement: Secondary | ICD-10-CM | POA: Diagnosis not present

## 2023-07-31 DIAGNOSIS — M79641 Pain in right hand: Secondary | ICD-10-CM | POA: Diagnosis not present

## 2023-07-31 DIAGNOSIS — Z79899 Other long term (current) drug therapy: Secondary | ICD-10-CM | POA: Diagnosis not present

## 2023-07-31 DIAGNOSIS — M79642 Pain in left hand: Secondary | ICD-10-CM | POA: Diagnosis not present

## 2023-11-06 DIAGNOSIS — M0579 Rheumatoid arthritis with rheumatoid factor of multiple sites without organ or systems involvement: Secondary | ICD-10-CM | POA: Diagnosis not present

## 2023-11-30 DIAGNOSIS — Z125 Encounter for screening for malignant neoplasm of prostate: Secondary | ICD-10-CM | POA: Diagnosis not present

## 2023-11-30 DIAGNOSIS — E785 Hyperlipidemia, unspecified: Secondary | ICD-10-CM | POA: Diagnosis not present

## 2023-12-07 DIAGNOSIS — Z1331 Encounter for screening for depression: Secondary | ICD-10-CM | POA: Diagnosis not present

## 2023-12-07 DIAGNOSIS — Z1212 Encounter for screening for malignant neoplasm of rectum: Secondary | ICD-10-CM | POA: Diagnosis not present

## 2023-12-07 DIAGNOSIS — I10 Essential (primary) hypertension: Secondary | ICD-10-CM | POA: Diagnosis not present

## 2023-12-07 DIAGNOSIS — Z0000 Encounter for general adult medical examination without abnormal findings: Secondary | ICD-10-CM | POA: Diagnosis not present

## 2023-12-07 DIAGNOSIS — Z1339 Encounter for screening examination for other mental health and behavioral disorders: Secondary | ICD-10-CM | POA: Diagnosis not present

## 2023-12-07 DIAGNOSIS — R82998 Other abnormal findings in urine: Secondary | ICD-10-CM | POA: Diagnosis not present

## 2024-03-09 DIAGNOSIS — Z79899 Other long term (current) drug therapy: Secondary | ICD-10-CM | POA: Diagnosis not present

## 2024-03-09 DIAGNOSIS — M0579 Rheumatoid arthritis with rheumatoid factor of multiple sites without organ or systems involvement: Secondary | ICD-10-CM | POA: Diagnosis not present

## 2024-03-09 DIAGNOSIS — M79641 Pain in right hand: Secondary | ICD-10-CM | POA: Diagnosis not present

## 2024-03-09 DIAGNOSIS — M79642 Pain in left hand: Secondary | ICD-10-CM | POA: Diagnosis not present
